# Patient Record
Sex: Female | Born: 1975
Health system: Southern US, Community
[De-identification: ages and names within clinical notes are randomized; demographics above are authoritative.]

## PROBLEM LIST (undated history)

## (undated) DIAGNOSIS — I10 Essential (primary) hypertension: Secondary | ICD-10-CM

## (undated) DIAGNOSIS — E669 Obesity, unspecified: Secondary | ICD-10-CM

## (undated) DIAGNOSIS — F319 Bipolar disorder, unspecified: Secondary | ICD-10-CM

## (undated) DIAGNOSIS — F329 Major depressive disorder, single episode, unspecified: Secondary | ICD-10-CM

## (undated) DIAGNOSIS — F419 Anxiety disorder, unspecified: Secondary | ICD-10-CM

## (undated) DIAGNOSIS — N921 Excessive and frequent menstruation with irregular cycle: Principal | ICD-10-CM

## (undated) HISTORY — DX: Major depressive disorder, single episode, unspecified: F32.9

## (undated) HISTORY — PX: WISDOM TOOTH EXTRACTION: SHX21

## (undated) HISTORY — DX: Obesity, unspecified: E66.9

## (undated) HISTORY — DX: Excessive and frequent menstruation with irregular cycle: N92.1

## (undated) HISTORY — DX: Bipolar disorder, unspecified: F31.9

## (undated) HISTORY — DX: Essential (primary) hypertension: I10

## (undated) HISTORY — DX: Anxiety disorder, unspecified: F41.9

---

## 1998-05-19 ENCOUNTER — Other Ambulatory Visit: Admission: RE | Admit: 1998-05-19 | Discharge: 1998-05-19 | Payer: Self-pay | Admitting: Obstetrics and Gynecology

## 1998-08-28 ENCOUNTER — Inpatient Hospital Stay (HOSPITAL_COMMUNITY): Admission: AD | Admit: 1998-08-28 | Discharge: 1998-08-28 | Payer: Self-pay | Admitting: Gynecology

## 1998-08-29 ENCOUNTER — Encounter: Payer: Self-pay | Admitting: Gynecology

## 1998-08-30 ENCOUNTER — Encounter: Payer: Self-pay | Admitting: Gynecology

## 1998-08-30 ENCOUNTER — Inpatient Hospital Stay (HOSPITAL_COMMUNITY): Admission: AD | Admit: 1998-08-30 | Discharge: 1998-08-31 | Payer: Self-pay | Admitting: Gynecology

## 1998-12-30 ENCOUNTER — Inpatient Hospital Stay (HOSPITAL_COMMUNITY): Admission: AD | Admit: 1998-12-30 | Discharge: 1999-01-01 | Payer: Self-pay | Admitting: Obstetrics and Gynecology

## 1999-01-22 ENCOUNTER — Encounter (HOSPITAL_COMMUNITY): Admission: RE | Admit: 1999-01-22 | Discharge: 1999-04-22 | Payer: Self-pay | Admitting: Obstetrics and Gynecology

## 1999-02-22 ENCOUNTER — Other Ambulatory Visit: Admission: RE | Admit: 1999-02-22 | Discharge: 1999-02-22 | Payer: Self-pay | Admitting: Obstetrics and Gynecology

## 2000-02-26 ENCOUNTER — Other Ambulatory Visit: Admission: RE | Admit: 2000-02-26 | Discharge: 2000-02-26 | Payer: Self-pay | Admitting: *Deleted

## 2002-11-09 ENCOUNTER — Other Ambulatory Visit: Admission: RE | Admit: 2002-11-09 | Discharge: 2002-11-09 | Payer: Self-pay | Admitting: Family Medicine

## 2004-05-05 ENCOUNTER — Ambulatory Visit: Payer: Self-pay | Admitting: Family Medicine

## 2004-08-03 ENCOUNTER — Ambulatory Visit: Payer: Self-pay | Admitting: Family Medicine

## 2004-11-03 ENCOUNTER — Other Ambulatory Visit (HOSPITAL_COMMUNITY): Admission: RE | Admit: 2004-11-03 | Discharge: 2004-11-20 | Payer: Self-pay | Admitting: Psychiatry

## 2004-11-03 ENCOUNTER — Ambulatory Visit: Payer: Self-pay | Admitting: Psychiatry

## 2010-12-09 ENCOUNTER — Inpatient Hospital Stay (INDEPENDENT_AMBULATORY_CARE_PROVIDER_SITE_OTHER)
Admission: RE | Admit: 2010-12-09 | Discharge: 2010-12-09 | Disposition: A | Discharge status: Home / Self Care | Payer: Medicaid Other | Source: Ambulatory Visit | Attending: Emergency Medicine | Admitting: Emergency Medicine

## 2010-12-09 DIAGNOSIS — IMO0002 Reserved for concepts with insufficient information to code with codable children: Secondary | ICD-10-CM

## 2010-12-09 DIAGNOSIS — X500XXA Overexertion from strenuous movement or load, initial encounter: Secondary | ICD-10-CM

## 2011-03-22 ENCOUNTER — Ambulatory Visit (INDEPENDENT_AMBULATORY_CARE_PROVIDER_SITE_OTHER): Payer: Medicaid Other | Admitting: Physician Assistant

## 2011-03-22 ENCOUNTER — Other Ambulatory Visit (HOSPITAL_COMMUNITY)
Admission: RE | Admit: 2011-03-22 | Discharge: 2011-03-22 | Disposition: A | Discharge status: Home / Self Care | Payer: Medicaid Other | Source: Ambulatory Visit | Attending: Physician Assistant | Admitting: Physician Assistant

## 2011-03-22 ENCOUNTER — Encounter: Payer: Self-pay | Admitting: Physician Assistant

## 2011-03-22 VITALS — BP 152/100 | HR 89 | Temp 98.6°F | Ht 64.0 in | Wt 242.5 lb

## 2011-03-22 DIAGNOSIS — N921 Excessive and frequent menstruation with irregular cycle: Secondary | ICD-10-CM

## 2011-03-22 DIAGNOSIS — F329 Major depressive disorder, single episode, unspecified: Secondary | ICD-10-CM

## 2011-03-22 DIAGNOSIS — N920 Excessive and frequent menstruation with regular cycle: Secondary | ICD-10-CM

## 2011-03-22 DIAGNOSIS — Z23 Encounter for immunization: Secondary | ICD-10-CM

## 2011-03-22 DIAGNOSIS — F32A Depression, unspecified: Secondary | ICD-10-CM

## 2011-03-22 LAB — POCT PREGNANCY, URINE: Preg Test, Ur: NEGATIVE

## 2011-03-22 MED ORDER — HYDROCHLOROTHIAZIDE 25 MG PO TABS: 25.0000 mg | ORAL_TABLET | Freq: Every day | ORAL | Status: DC

## 2011-03-22 MED ORDER — IBUPROFEN 600 MG PO TABS: 600.0000 mg | ORAL_TABLET | Freq: Once | ORAL | Status: AC

## 2011-03-22 NOTE — Patient Instructions (Signed)
Levonorgestrel intrauterine device (IUD) What is this medicine? LEVONORGESTREL IUD (LEE voe nor jes trel) is a contraceptive (birth control) device. It is used to prevent pregnancy and to treat heavy bleeding that occurs during your period. It can be used for up to 5 years. This medicine may be used for other purposes; ask your health care provider or pharmacist if you have questions. What should I tell my health care provider before I take this medicine? They need to know if you have any of these conditions: -abnormal Pap smear -cancer of the breast, uterus, or cervix -diabetes -endometritis -genital or pelvic infection now or in the past -have more than one sexual partner or your partner has more than one partner -heart disease -history of an ectopic or tubal pregnancy -immune system problems -IUD in place -liver disease or tumor -problems with blood clots or take blood-thinners -use intravenous drugs -uterus of unusual shape -vaginal bleeding that has not been explained -an unusual or allergic reaction to levonorgestrel, other hormones, silicone, or polyethylene, medicines, foods, dyes, or preservatives -pregnant or trying to get pregnant -breast-feeding How should I use this medicine? This device is placed inside the uterus by a health care professional. Talk to your pediatrician regarding the use of this medicine in children. Special care may be needed. Overdosage: If you think you have taken too much of this medicine contact a poison control center or emergency room at once. NOTE: This medicine is only for you. Do not share this medicine with others. What if I miss a dose? This does not apply. What may interact with this medicine? Do not take this medicine with any of the following medications: -amprenavir -bosentan -fosamprenavir This medicine may also interact with the following medications: -aprepitant -barbiturate medicines for inducing sleep or treating  seizures -bexarotene -griseofulvin -medicines to treat seizures like carbamazepine, ethotoin, felbamate, oxcarbazepine, phenytoin, topiramate -modafinil -pioglitazone -rifabutin -rifampin -rifapentine -some medicines to treat HIV infection like atazanavir, indinavir, lopinavir, nelfinavir, tipranavir, ritonavir -St. John's wort -warfarin This list may not describe all possible interactions. Give your health care provider a list of all the medicines, herbs, non-prescription drugs, or dietary supplements you use. Also tell them if you smoke, drink alcohol, or use illegal drugs. Some items may interact with your medicine. What should I watch for while using this medicine? Visit your doctor or health care professional for regular check ups. See your doctor if you or your partner has sexual contact with others, becomes HIV positive, or gets a sexual transmitted disease. This product does not protect you against HIV infection (AIDS) or other sexually transmitted diseases. You can check the placement of the IUD yourself by reaching up to the top of your vagina with clean fingers to feel the threads. Do not pull on the threads. It is a good habit to check placement after each menstrual period. Call your doctor right away if you feel more of the IUD than just the threads or if you cannot feel the threads at all. The IUD may come out by itself. You may become pregnant if the device comes out. If you notice that the IUD has come out use a backup birth control method like condoms and call your health care provider. Using tampons will not change the position of the IUD and are okay to use during your period. What side effects may I notice from receiving this medicine? Side effects that you should report to your doctor or health care professional as soon as possible: -allergic reactions   like skin rash, itching or hives, swelling of the face, lips, or tongue -fever, flu-like symptoms -genital sores -high  blood pressure -no menstrual period for 6 weeks during use -pain, swelling, warmth in the leg -pelvic pain or tenderness -severe or sudden headache -signs of pregnancy -stomach cramping -sudden shortness of breath -trouble with balance, talking, or walking -unusual vaginal bleeding, discharge -yellowing of the eyes or skin Side effects that usually do not require medical attention (report to your doctor or health care professional if they continue or are bothersome): -acne -breast pain -change in sex drive or performance -changes in weight -cramping, dizziness, or faintness while the device is being inserted -headache -irregular menstrual bleeding within first 3 to 6 months of use -nausea This list may not describe all possible side effects. Call your doctor for medical advice about side effects. You may report side effects to FDA at 1-800-FDA-1088. Where should I keep my medicine? This does not apply. NOTE: This sheet is a summary. It may not cover all possible information. If you have questions about this medicine, talk to your doctor, pharmacist, or health care provider.  2012, Elsevier/Gold Standard. (06/11/2008 6:39:08 PM)Endometrial Biopsy This is a test in which a tissue sample (a biopsy) is taken from inside the uterus (womb). It is then looked at by a specialist under a microscope to see if the tissue is normal or abnormal. The endometrium is the lining of the uterus. This test helps determine where you are in your menstrual cycle and how hormone levels are affecting the lining of the uterus. Another use for this test is to diagnose endometrial cancer, tuberculosis, polyps, or inflammatory conditions and to evaluate uterine bleeding. PREPARATION FOR TEST No preparation or fasting is necessary. NORMAL FINDINGS No pathologic conditions. Presence of "secretory-type" endometrium 3 to 5 days before to normal menstruation. Ranges for normal findings may vary among different  laboratories and hospitals. You should always check with your doctor after having lab work or other tests done to discuss the meaning of your test results and whether your values are considered within normal limits. MEANING OF TEST  Your caregiver will go over the test results with you and discuss the importance and meaning of your results, as well as treatment options and the need for additional tests if necessary. OBTAINING THE TEST RESULTS It is your responsibility to obtain your test results. Ask the lab or department performing the test when and how you will get your results. Document Released: 09/21/2004 Document Revised: 01/31/2011 Document Reviewed: 04/30/2008 [facility] Patient Information 2012 [place], [place].

## 2011-03-22 NOTE — Progress Notes (Signed)
Chief Complaint:  Menorrhagia   Teresa Herrera is  35 y.o. G2P1011.  Patient's last menstrual period was 02/14/2011.Marland Kitchen  Her pregnancy status is negative.  She presents complaining of Menorrhagia . Onset is described as sudden and has been present for  6 months.   Obstetrical/Gynecological History: Pertinent Gynecological History: Menses: flow is moderate, irregular occurring approximately every 28 days with spotting approximately 28 days per month and moderate cramping, golfball-sized clots Bleeding: intermenstrual bleeding and dysfunctional uterine bleeding Contraception: abstinence DES exposure: denies Blood transfusions: none Sexually transmitted diseases: no past history Previous GYN Procedures: none  Last mammogram: n/a Date:  Last pap: normal Date: last last year   Past Medical History: Past Medical History  Diagnosis Date  . Depression   . Bipolar 1 disorder     Past Surgical History: History reviewed. No pertinent past surgical history.  Family History: Family History  Problem Relation Age of Onset  . Hypertension Mother     Social History: History  Substance Use Topics  . Smoking status: Never Smoker   . Smokeless tobacco: Never Used  . Alcohol Use: 0.0 oz/week     drink socially    Allergies:  Allergies  Allergen Reactions  . Cauliflower Hives  . Demerol     Hallucinations   . Sulfa Drugs Cross Reactors Hives    Review of Systems - Negative except has been reviewed in the HPI  Physical Exam   Blood pressure 152/100, pulse 89, temperature 98.6 F (37 C), temperature source Oral, height 5\' 4"  (1.626 m), weight 242 lb 8 oz (109.997 kg), last menstrual period 02/14/2011.  General: General appearance - alert, well appearing, and in no distress, oriented to person, place, and time and overweight Mental status - alert, oriented to person, place, and time, normal mood, behavior, speech, dress, motor activity, and thought processes, affect  appropriate to mood Abdomen - soft, nontender, nondistended, no masses or organomegaly no CVA tenderness Focused Gynecological Exam: VULVA: normal appearing vulva with no masses, tenderness or lesions, VAGINA: vaginal discharge - bloody, CERVIX: normal appearing cervix without discharge or lesions, multiparous os, UTERUS: exam limited by pt habitus, nontender, ADNEXA:  exam limited by pt habitus, nontender,  Patient given informed consent, signed copy in the chart, time out was performed. Appropriate time out taken. . The patient was placed in the lithotomy position and the cervix brought into view with sterile speculum.  Portio of cervix cleansed x 2 with betadine swabs.  A tenaculum was placed in the anterior lip of the cervix.  The uterus was sounded for depth of 8.5cm. A pipelle was introduced to into the uterus, suction created,  and an endometrial sample was obtained. All equipment was removed and accounted for.  The patient tolerated the procedure well.    Labs: Recent Results (from the past 24 hour(s))  POCT PREGNANCY, URINE   Collection Time   03/22/11  1:50 PM      Component Value Range   Preg Test, Ur NEGATIVE      Assessment: Menorrhagia Hytertension  Plan: Endo Bx obtained Pelvic US scheduled Treatment options reviewed, pt strongly desires Mirena FU in 2-3 weeks for likely Mirena placement and review of results Rx HCTZ 25mg  po daily, referral to [facility] for primary care  Darria Corvera E. 03/22/2011,2:21 PM

## 2011-03-23 ENCOUNTER — Encounter: Payer: Self-pay | Admitting: Physician Assistant

## 2011-03-23 DIAGNOSIS — F32A Depression, unspecified: Secondary | ICD-10-CM | POA: Insufficient documentation

## 2011-03-23 DIAGNOSIS — F329 Major depressive disorder, single episode, unspecified: Secondary | ICD-10-CM | POA: Insufficient documentation

## 2011-03-23 DIAGNOSIS — N921 Excessive and frequent menstruation with irregular cycle: Secondary | ICD-10-CM | POA: Insufficient documentation

## 2011-03-23 HISTORY — DX: Excessive and frequent menstruation with irregular cycle: N92.1

## 2011-03-23 HISTORY — DX: Depression, unspecified: F32.A

## 2011-03-28 ENCOUNTER — Ambulatory Visit (HOSPITAL_COMMUNITY)
Admission: RE | Admit: 2011-03-28 | Discharge: 2011-03-28 | Disposition: A | Discharge status: Home / Self Care | Payer: Medicaid Other | Source: Ambulatory Visit | Attending: Physician Assistant | Admitting: Physician Assistant

## 2011-03-28 DIAGNOSIS — N949 Unspecified condition associated with female genital organs and menstrual cycle: Secondary | ICD-10-CM | POA: Insufficient documentation

## 2011-03-28 DIAGNOSIS — N925 Other specified irregular menstruation: Secondary | ICD-10-CM | POA: Insufficient documentation

## 2011-03-28 DIAGNOSIS — D251 Intramural leiomyoma of uterus: Secondary | ICD-10-CM | POA: Insufficient documentation

## 2011-03-28 DIAGNOSIS — N921 Excessive and frequent menstruation with irregular cycle: Secondary | ICD-10-CM

## 2011-03-28 DIAGNOSIS — N938 Other specified abnormal uterine and vaginal bleeding: Secondary | ICD-10-CM | POA: Insufficient documentation

## 2011-04-30 ENCOUNTER — Ambulatory Visit: Payer: Medicaid Other | Admitting: Advanced Practice Midwife

## 2011-05-03 ENCOUNTER — Ambulatory Visit (INDEPENDENT_AMBULATORY_CARE_PROVIDER_SITE_OTHER): Payer: Medicaid Other | Admitting: Advanced Practice Midwife

## 2011-05-03 ENCOUNTER — Encounter: Payer: Self-pay | Admitting: Advanced Practice Midwife

## 2011-05-03 VITALS — BP 142/86 | HR 89 | Temp 98.0°F | Ht 64.0 in | Wt 249.7 lb

## 2011-05-03 DIAGNOSIS — Z01812 Encounter for preprocedural laboratory examination: Secondary | ICD-10-CM

## 2011-05-03 DIAGNOSIS — Z30430 Encounter for insertion of intrauterine contraceptive device: Secondary | ICD-10-CM

## 2011-05-03 DIAGNOSIS — N920 Excessive and frequent menstruation with regular cycle: Secondary | ICD-10-CM

## 2011-05-03 DIAGNOSIS — Z23 Encounter for immunization: Secondary | ICD-10-CM

## 2011-05-03 DIAGNOSIS — N921 Excessive and frequent menstruation with irregular cycle: Secondary | ICD-10-CM

## 2011-05-03 LAB — CBC
HCT: 35.3 % — ABNORMAL LOW (ref 36.0–46.0)
Hemoglobin: 11.4 g/dL — ABNORMAL LOW (ref 12.0–15.0)
MCH: 26.2 pg (ref 26.0–34.0)
MCHC: 32.3 g/dL (ref 30.0–36.0)
MCV: 81.1 fL (ref 78.0–100.0)
Platelets: 366 10*3/uL (ref 150–400)
RBC: 4.35 MIL/uL (ref 3.87–5.11)
RDW: 14.2 % (ref 11.5–15.5)
WBC: 10.2 10*3/uL (ref 4.0–10.5)

## 2011-05-03 LAB — TSH: TSH: 1.823 u[IU]/mL (ref 0.350–4.500)

## 2011-05-03 LAB — POCT PREGNANCY, URINE: Preg Test, Ur: NEGATIVE

## 2011-05-03 MED ORDER — IBUPROFEN 200 MG PO TABS
600.0000 mg | ORAL_TABLET | Freq: Once | ORAL | Status: AC
  Administered 2011-05-03: 600 mg via ORAL

## 2011-05-03 NOTE — Progress Notes (Signed)
  Subjective:    Patient ID: Teresa Herrera, female    DOB: 07-26-1975, 35 y.o.   MRN: [id number]  HPI: Desires Mirena for Tx of menometrorrhagia. Not sexually active. Declines GC/CT testing. Review [place] results.  Review of Systems: Neg     Objective:   Physical Exam Procedure  IUD Insertion Procedure Note  Pre-operative Diagnosis: Menometrorrhagia  Post-operative Diagnosis: same  Indications: Menometrorrhagia  Procedure Details  Urine pregnancy test was done today and result was neg.  The risks (including infection, bleeding, pain, and uterine perforation) and benefits of the procedure were explained to the patient and Written informed consent was obtained.    Cervix cleansed with Betadine. Uterus sounded to 7 cm. IUD inserted without difficulty. String visible and trimmed. Patient tolerated procedure well initially, but started cramping a few minutes after sitting up. She was given Ibuprofen and felt better. She went to the lab for flu vaccine and vomited ~ 20 minutes after insertion, then reported feeling fine. No VB or further cramping.  IUD Information: See RN note  Condition: Stable  Complications: None  Plan:  The patient was advised to call for any fever or for prolonged or severe pain or bleeding. She was advised to use OTC ibuprofen as needed for mild to moderate pain.      Assessment & Plan:  F/U 6 weeks for string check or PRN for worsening of Sx. FU vaccine given  Karol Skarzynski 05/03/2011 3:12 PM

## 2011-05-03 NOTE — Progress Notes (Signed)
After IUD insertion completed, while patient in lab area to get blood draw, pt. C/o severe cramps and vomitied repeatedly small amounts digested food/liquid. States cramps relieved after vomiting. Had patient to rest for a few minutes until sure she felt well before she left.

## 2011-05-03 NOTE — Patient Instructions (Signed)

## 2011-06-14 ENCOUNTER — Encounter: Payer: Self-pay | Admitting: Physician Assistant

## 2011-06-14 ENCOUNTER — Ambulatory Visit (INDEPENDENT_AMBULATORY_CARE_PROVIDER_SITE_OTHER): Payer: Medicaid Other | Admitting: Physician Assistant

## 2011-06-14 VITALS — BP 148/93 | HR 86 | Temp 97.0°F | Ht 64.5 in | Wt 248.3 lb

## 2011-06-14 DIAGNOSIS — Z30431 Encounter for routine checking of intrauterine contraceptive device: Secondary | ICD-10-CM

## 2011-06-14 NOTE — Patient Instructions (Signed)

## 2011-06-14 NOTE — Progress Notes (Signed)
Chief Complaint:  IUD string check   Teresa Herrera is  36 y.o. G2P1011.  Patient's last menstrual period was 06/11/2011..  She presents complaining of IUD string check  Reports spotting only. Very pleased with results of Mirena in decreasing bleeding. Reports occassional lower abd cramping well controlled with prn NSAIDs  Obstetrical/Gynecological History: OB History    Grav Para Term Preterm Abortions TAB SAB Ect Mult Living   2 1 1  1  0 1 0 0 1      Past Medical History: Past Medical History  Diagnosis Date  . Bipolar 1 disorder   . Menometrorrhagia 03/23/2011  . Depression   . Depression 03/23/2011  . Hypertension     Past Surgical History: Past Surgical History  Procedure Date  . Wisdom tooth extraction     Family History: Family History  Problem Relation Age of Onset  . Hypertension Mother     Social History: History  Substance Use Topics  . Smoking status: Never Smoker   . Smokeless tobacco: Never Used  . Alcohol Use: 0.0 oz/week     drink socially    Allergies:  Allergies  Allergen Reactions  . Cauliflower Hives  . Demerol     Hallucinations   . Sulfa Drugs Cross Reactors Hives     (Not in a hospital admission)  Review of Systems - Negative except what has been reviewed in HPI  Physical Exam   Blood pressure 148/93, pulse 86, temperature 97 F (36.1 C), temperature source Oral, height 5' 4.5" (1.638 m), weight 248 lb 4.8 oz (112.628 kg), last menstrual period 06/11/2011.  General: General appearance - alert, well appearing, and in no distress, oriented to person, place, and time and overweight Mental status - alert, oriented to person, place, and time, normal mood, behavior, speech, dress, motor activity, and thought processes, affect appropriate to mood Focused Gynecological Exam: IUD strings visualized below cervical os   Assessment: IUD string check Patient Active Problem List  Diagnoses  . Depression  . Menometrorrhagia     Plan: FU prn problems PAP due 2015  Nakia Remmers E. 06/14/2011,4:08 PM

## 2012-03-19 ENCOUNTER — Encounter: Payer: No Typology Code available for payment source | Attending: Family Medicine | Admitting: *Deleted

## 2012-03-19 ENCOUNTER — Encounter: Payer: Self-pay | Admitting: *Deleted

## 2012-03-19 VITALS — Ht 64.25 in | Wt 246.9 lb

## 2012-03-19 DIAGNOSIS — Z713 Dietary counseling and surveillance: Secondary | ICD-10-CM | POA: Insufficient documentation

## 2012-03-19 DIAGNOSIS — I10 Essential (primary) hypertension: Secondary | ICD-10-CM | POA: Insufficient documentation

## 2012-03-19 NOTE — Progress Notes (Signed)
  Medical Nutrition Therapy:  Appt start time: 1130 end time:  1230.  Assessment: Morbid Obesity, HTN.  Teresa Herrera comes today for MNT and states she needs to lose 25 lbs. Has HTN and states MD wants her to cut salt out of her diet. Reports she is "stabilizing" from forgetting to take her meds for 3 days. Cannot afford fresh F/V or lean cuts of meat and skips lunch daily. Eats candy and meals out often because "it's cheaper". Trying to stay away from lunch meat and other foods with high sodium. Has gotten better about not eating sweets and has switched to diet soda.  No structured exercise reported.  MEDICATIONS: See medications. Forgets to take them for 2-3 days at a time.    DIETARY INTAKE:  Usual eating pattern includes 1-2 meals and 0-2 snacks per day.  24-hr recall:  B (AM): NONE or fruit smoothie w/ greek yogurt (1-2 Tbsp) and a little mango or green Naked juice (made at home)  Snk (AM): None (d/t no snacks available) or candy (cheaper and available)  L (PM): NONE/Water  Snk ( PM): None D ( PM): Spaghetti or roast beef sandwich ([employer]) Snk ( PM): None Beverages: water, soda/diet soda  Usual physical activity:  None reported; states "I am constantly on the go"  Estimated energy needs: 1300 calories 150 g carbohydrates 90 g protein 40 g fat  Progress Towards Goal(s):  Some progress.   Nutritional Diagnosis:  Sherman-3.3 Overweight/obesity related to h/o poor food choices as evidenced by patient referral for MNT with BMI of 42.1 kg/m^2.    Intervention:  Nutrition education.  Handouts given at visit include:   HTN handout  Low Sodium Seasonings handout  30g CHO menu  Meal Planning Card  Goals:  Eat 3 meals/day; Avoid meal skipping   Increase protein rich foods  Limit carbohydrate to 2 servings (30 grams) per meal and 1 serving (15 grams) per snack  Choose more whole grains, lean protein, low-fat dairy, and fruits/non-starchy vegetables   Aim for >30 min of physical  activity daily  Limit sugar-sweetened beverages and concentrated sweets  Aim for <2000 mg of sodium daily - Watch processed foods (see handout)  Monitoring/Evaluation:  Dietary intake, exercise, BP, and body weight in 6 week(s).

## 2012-03-25 ENCOUNTER — Encounter: Payer: Self-pay | Admitting: *Deleted

## 2012-03-25 NOTE — Patient Instructions (Addendum)
Goals:  Eat 3 meals/day, Avoid meal skipping   Increase protein rich foods  Limit carbohydrate to 2 servings (30 grams) per meal and 1 serving (15 grams) per snack.  Choose more whole grains, lean protein, low-fat dairy, and fruits/non-starchy vegetables.   Aim for >30 min of physical activity daily  Limit sugar-sweetened beverages and concentrated sweets  Aim for <2000 mg of sodium daily - Watch processed foods (see handout)

## 2013-12-19 ENCOUNTER — Emergency Department (HOSPITAL_COMMUNITY)
Admission: EM | Admit: 2013-12-19 | Discharge: 2013-12-19 | Disposition: A | Discharge status: Home / Self Care | Payer: BC Managed Care – PPO | Source: Home / Self Care | Attending: Emergency Medicine | Admitting: Emergency Medicine

## 2013-12-19 ENCOUNTER — Encounter (HOSPITAL_COMMUNITY): Payer: Self-pay | Admitting: Emergency Medicine

## 2013-12-19 DIAGNOSIS — H60399 Other infective otitis externa, unspecified ear: Secondary | ICD-10-CM

## 2013-12-19 DIAGNOSIS — H6001 Abscess of right external ear: Secondary | ICD-10-CM

## 2013-12-19 MED ORDER — HYDROMORPHONE HCL PF 1 MG/ML IJ SOLN
2.0000 mg | Freq: Once | INTRAMUSCULAR | Status: AC
  Administered 2013-12-19: 2 mg via INTRAMUSCULAR

## 2013-12-19 MED ORDER — CEFTRIAXONE SODIUM 1 G IJ SOLR
INTRAMUSCULAR | Status: AC
  Filled 2013-12-19: qty 10

## 2013-12-19 MED ORDER — LIDOCAINE HCL (PF) 1 % IJ SOLN
INTRAMUSCULAR | Status: AC
  Filled 2013-12-19: qty 5

## 2013-12-19 MED ORDER — CLINDAMYCIN HCL 300 MG PO CAPS: 300.0000 mg | ORAL_CAPSULE | Freq: Four times a day (QID) | ORAL | Status: DC

## 2013-12-19 MED ORDER — HYDROMORPHONE HCL PF 1 MG/ML IJ SOLN
INTRAMUSCULAR | Status: AC
  Filled 2013-12-19: qty 2

## 2013-12-19 MED ORDER — CEFTRIAXONE SODIUM 1 G IJ SOLR
1.0000 g | Freq: Once | INTRAMUSCULAR | Status: AC
  Administered 2013-12-19: 1 g via INTRAMUSCULAR

## 2013-12-19 MED ORDER — ONDANSETRON 4 MG PO TBDP
8.0000 mg | ORAL_TABLET | Freq: Once | ORAL | Status: AC
  Administered 2013-12-19: 8 mg via ORAL

## 2013-12-19 MED ORDER — OXYCODONE-ACETAMINOPHEN 5-325 MG PO TABS: ORAL_TABLET | ORAL | Status: DC

## 2013-12-19 MED ORDER — ONDANSETRON 4 MG PO TBDP
ORAL_TABLET | ORAL | Status: AC
  Filled 2013-12-19: qty 2

## 2013-12-19 NOTE — ED Notes (Signed)
C/o pain in right ear since Thursday.  Denies fever and drainage.  No relief with otc ear drops.  States "ear drops seem to have made it worse".

## 2013-12-19 NOTE — ED Provider Notes (Signed)
Chief Complaint   Chief Complaint  Patient presents with  . Otalgia    History of Present Illness   Teresa Herrera is a 38 year old female who has had a three-day history of severe right ear pain. She denies any drainage. She's had no fever, headache, stiff neck, nasal congestion, rhinorrhea, sore throat, or adenopathy. She denies getting any water in her ear or swimming.  Review of Systems   Other than as noted above, the patient denies any of the following symptoms: Systemic:  No fevers or chills. Eye:  No redness, pain, discharge, itching, blurred vision, or diplopia. ENT:  No headache, nasal congestion, sneezing, itching, epistaxis, ear pain, decreased hearing, ringing in ears, vertigo, or tinnitus.  No oral lesions, sore throat, or hoarseness. Neck:  No neck pain or adenopathy. Skin:  No rash or itching.  Cole   Past medical history, family history, social history, meds, and allergies were reviewed. She is allergic to Demerol and sulfa.  Physical Examination     Vital signs:  BP 139/91  Pulse 86  Temp(Src) 98.6 F (37 C) (Oral)  Resp 18  SpO2 98% General:  Alert and oriented. She is crying in pain.  Skin warm and dry. Eye:  PERRL, full EOMs, lids and conjunctiva normal.   ENT:  She has a small furuncle on her right external ear canal, just inside the ear canal, inferiorly. The remainder of the ear canal was normal as was the tympanic membrane, left TM and canal are normal.  Nasal mucosa not congested and without drainage.  Mucous membranes moist, no oral lesions, normal dentition, pharynx clear.  No cranial or facial pain to palplation. There is no pain or swelling over the mastoid. Neck:  Supple, full ROM.  No adenopathy, tenderness or mass.  Thyroid normal. Lungs:  Breath sounds clear and equal bilaterally.  No wheezes, rales or rhonchi. Heart:  Rhythm regular, without extrasystoles.  No gallops or murmers. Skin:  Clear, warm and dry.  Course in Urgent [facility]   The following medications were given:  Medications  HYDROmorphone (DILAUDID) injection 2 mg (2 mg Intramuscular Given 12/19/13 1425)  cefTRIAXone (ROCEPHIN) injection 1 g (1 g Intramuscular Given 12/19/13 1425)  ondansetron (ZOFRAN-ODT) disintegrating tablet 8 mg (8 mg Oral Given 12/19/13 1425)    Assessment   The encounter diagnosis was Abscess, ear canal, right.  This is a very small abscess, but she has lots of pain with it. She'll be covered with medicines as listed below. Return again in 48 hours if she is no better, or sooner if she becomes worse.  Plan    1.  Meds:  The following meds were prescribed:   Discharge Medication List as of 12/19/2013  2:07 PM    START taking these medications   Details  clindamycin (CLEOCIN) 300 MG capsule Take 1 capsule (300 mg total) by mouth 4 (four) times daily., Starting 12/19/2013, Until Discontinued, Print    oxyCODONE-acetaminophen (PERCOCET) 5-325 MG per tablet 1 to 2 tablets every 6 hours as needed for pain., Print        2.  Patient Education/Counseling:  The patient was given appropriate handouts, self care instructions, and instructed in symptomatic relief.  Suggested moist, warm compresses to the ear.  3.  Follow up:  The patient was told to follow up here if no better in 3 to 4 days, or sooner if becoming worse in any way, and given some red flag symptoms such as increasing pain, fever,  headache, or stiff neck which would prompt immediate return.       Harden Mo, MD 12/19/13 [phone]

## 2013-12-19 NOTE — Discharge Instructions (Signed)

## 2013-12-19 NOTE — ED Notes (Signed)
Pt given injections.  Will discharge at 2:40 p.m.  Mw,cma

## 2014-01-08 ENCOUNTER — Emergency Department (HOSPITAL_COMMUNITY): Payer: BC Managed Care – PPO

## 2014-01-08 ENCOUNTER — Emergency Department (HOSPITAL_COMMUNITY)
Admission: EM | Admit: 2014-01-08 | Discharge: 2014-01-08 | Disposition: A | Discharge status: Home / Self Care | Payer: BC Managed Care – PPO | Attending: Emergency Medicine | Admitting: Emergency Medicine

## 2014-01-08 ENCOUNTER — Encounter (HOSPITAL_COMMUNITY): Payer: Self-pay | Admitting: Emergency Medicine

## 2014-01-08 DIAGNOSIS — Z8742 Personal history of other diseases of the female genital tract: Secondary | ICD-10-CM | POA: Insufficient documentation

## 2014-01-08 DIAGNOSIS — R112 Nausea with vomiting, unspecified: Secondary | ICD-10-CM | POA: Insufficient documentation

## 2014-01-08 DIAGNOSIS — R5381 Other malaise: Secondary | ICD-10-CM | POA: Insufficient documentation

## 2014-01-08 DIAGNOSIS — Z3202 Encounter for pregnancy test, result negative: Secondary | ICD-10-CM | POA: Insufficient documentation

## 2014-01-08 DIAGNOSIS — R1084 Generalized abdominal pain: Secondary | ICD-10-CM

## 2014-01-08 DIAGNOSIS — Z79899 Other long term (current) drug therapy: Secondary | ICD-10-CM | POA: Insufficient documentation

## 2014-01-08 DIAGNOSIS — R197 Diarrhea, unspecified: Secondary | ICD-10-CM | POA: Insufficient documentation

## 2014-01-08 DIAGNOSIS — F319 Bipolar disorder, unspecified: Secondary | ICD-10-CM | POA: Insufficient documentation

## 2014-01-08 DIAGNOSIS — F411 Generalized anxiety disorder: Secondary | ICD-10-CM | POA: Insufficient documentation

## 2014-01-08 DIAGNOSIS — Z792 Long term (current) use of antibiotics: Secondary | ICD-10-CM | POA: Insufficient documentation

## 2014-01-08 DIAGNOSIS — I10 Essential (primary) hypertension: Secondary | ICD-10-CM | POA: Insufficient documentation

## 2014-01-08 DIAGNOSIS — R5383 Other fatigue: Secondary | ICD-10-CM

## 2014-01-08 DIAGNOSIS — R509 Fever, unspecified: Secondary | ICD-10-CM | POA: Insufficient documentation

## 2014-01-08 DIAGNOSIS — E669 Obesity, unspecified: Secondary | ICD-10-CM | POA: Insufficient documentation

## 2014-01-08 DIAGNOSIS — A0472 Enterocolitis due to Clostridium difficile, not specified as recurrent: Secondary | ICD-10-CM

## 2014-01-08 LAB — COMPREHENSIVE METABOLIC PANEL
ALT: 17 U/L (ref 0–35)
AST: 21 U/L (ref 0–37)
Albumin: 3 g/dL — ABNORMAL LOW (ref 3.5–5.2)
Alkaline Phosphatase: 98 U/L (ref 39–117)
Anion gap: 12 (ref 5–15)
BUN: 5 mg/dL — ABNORMAL LOW (ref 6–23)
CO2: 23 mEq/L (ref 19–32)
Calcium: 8.6 mg/dL (ref 8.4–10.5)
Chloride: 102 mEq/L (ref 96–112)
Creatinine, Ser: 1.03 mg/dL (ref 0.50–1.10)
GFR calc Af Amer: 79 mL/min — ABNORMAL LOW (ref >90)
GFR calc non Af Amer: 68 mL/min — ABNORMAL LOW (ref >90)
Glucose, Bld: 97 mg/dL (ref 70–99)
Potassium: 4 mEq/L (ref 3.7–5.3)
Sodium: 137 mEq/L (ref 137–147)
Total Bilirubin: 0.2 mg/dL — ABNORMAL LOW (ref 0.3–1.2)
Total Protein: 7.7 g/dL (ref 6.0–8.3)

## 2014-01-08 LAB — CBC WITH DIFFERENTIAL/PLATELET
Basophils Absolute: 0.1 10*3/uL (ref 0.0–0.1)
Basophils Relative: 1 % (ref 0–1)
Eosinophils Absolute: 0 10*3/uL (ref 0.0–0.7)
Eosinophils Relative: 0 % (ref 0–5)
HCT: 36.3 % (ref 36.0–46.0)
Hemoglobin: 12.2 g/dL (ref 12.0–15.0)
Lymphocytes Relative: 22 % (ref 12–46)
Lymphs Abs: 2.1 10*3/uL (ref 0.7–4.0)
MCH: 28.8 pg (ref 26.0–34.0)
MCHC: 33.6 g/dL (ref 30.0–36.0)
MCV: 85.8 fL (ref 78.0–100.0)
Monocytes Absolute: 0.7 10*3/uL (ref 0.1–1.0)
Monocytes Relative: 8 % (ref 3–12)
Neutro Abs: 6.4 10*3/uL (ref 1.7–7.7)
Neutrophils Relative %: 69 % (ref 43–77)
Platelets: 277 10*3/uL (ref 150–400)
RBC: 4.23 MIL/uL (ref 3.87–5.11)
RDW: 13.6 % (ref 11.5–15.5)
WBC: 9.3 10*3/uL (ref 4.0–10.5)

## 2014-01-08 LAB — URINALYSIS, ROUTINE W REFLEX MICROSCOPIC
Bilirubin Urine: NEGATIVE
Glucose, UA: NEGATIVE mg/dL
Ketones, ur: NEGATIVE mg/dL
Nitrite: NEGATIVE
Protein, ur: NEGATIVE mg/dL
Specific Gravity, Urine: 1.011 (ref 1.005–1.030)
Urobilinogen, UA: 0.2 mg/dL (ref 0.0–1.0)
pH: 7 (ref 5.0–8.0)

## 2014-01-08 LAB — URINE MICROSCOPIC-ADD ON

## 2014-01-08 LAB — PREGNANCY, URINE: Preg Test, Ur: NEGATIVE

## 2014-01-08 MED ORDER — SODIUM CHLORIDE 0.9 % IV BOLUS (SEPSIS): 1000.0000 mL | Freq: Once | INTRAVENOUS | Status: DC

## 2014-01-08 MED ORDER — FLUCONAZOLE 150 MG PO TABS: 150.0000 mg | ORAL_TABLET | Freq: Once | ORAL | Status: DC

## 2014-01-08 MED ORDER — SODIUM CHLORIDE 0.9 % IV BOLUS (SEPSIS)
2000.0000 mL | Freq: Once | INTRAVENOUS | Status: AC
  Administered 2014-01-08: 2000 mL via INTRAVENOUS

## 2014-01-08 MED ORDER — METRONIDAZOLE 500 MG PO TABS: 500.0000 mg | ORAL_TABLET | Freq: Two times a day (BID) | ORAL | Status: DC

## 2014-01-08 MED ORDER — ONDANSETRON HCL 4 MG/2ML IJ SOLN
4.0000 mg | Freq: Once | INTRAMUSCULAR | Status: AC
  Administered 2014-01-08: 4 mg via INTRAVENOUS
  Filled 2014-01-08: qty 2

## 2014-01-08 MED ORDER — MORPHINE SULFATE 4 MG/ML IJ SOLN
4.0000 mg | INTRAMUSCULAR | Status: DC | PRN
  Administered 2014-01-08: 4 mg via INTRAVENOUS
  Filled 2014-01-08: qty 1

## 2014-01-08 MED ORDER — GLYCOPYRROLATE 0.2 MG/ML IJ SOLN
0.1000 mg | Freq: Once | INTRAMUSCULAR | Status: AC
  Administered 2014-01-08: 0.1 mg via INTRAVENOUS
  Filled 2014-01-08: qty 1

## 2014-01-08 MED ORDER — HYDROCODONE-ACETAMINOPHEN 5-325 MG PO TABS: 1.0000 | ORAL_TABLET | ORAL | Status: DC | PRN

## 2014-01-08 MED ORDER — ACETAMINOPHEN 325 MG PO TABS
650.0000 mg | ORAL_TABLET | Freq: Once | ORAL | Status: AC
  Administered 2014-01-08: 650 mg via ORAL
  Filled 2014-01-08: qty 2

## 2014-01-08 MED ORDER — ONDANSETRON HCL 4 MG PO TABS: 4.0000 mg | ORAL_TABLET | Freq: Four times a day (QID) | ORAL | Status: DC

## 2014-01-08 MED ORDER — DICYCLOMINE HCL 20 MG PO TABS: 20.0000 mg | ORAL_TABLET | Freq: Two times a day (BID) | ORAL | Status: DC

## 2014-01-08 MED ORDER — SODIUM CHLORIDE 0.9 % IV SOLN
Freq: Once | INTRAVENOUS | Status: AC
  Administered 2014-01-08: 15:00:00 via INTRAVENOUS

## 2014-01-08 NOTE — ED Notes (Signed)
Fever Since Sunday.  Broke early week but has returned.  Shortness of breath since late last night.  Stools also have been loose since yesterday.

## 2014-01-08 NOTE — Discharge Instructions (Signed)
Abdominal Pain, Women °Abdominal (stomach, pelvic, or belly) pain can be caused by many things. It is important to tell your doctor: °· The location of the pain. °· Does it come and go or is it present all the time? °· Are there things that start the pain (eating certain foods, exercise)? °· Are there other symptoms associated with the pain (fever, nausea, vomiting, diarrhea)? °All of this is helpful to know when trying to find the cause of the pain. °CAUSES  °· Stomach: virus or bacteria infection, or ulcer. °· Intestine: appendicitis (inflamed appendix), regional ileitis (Crohn's disease), ulcerative colitis (inflamed colon), irritable bowel syndrome, diverticulitis (inflamed diverticulum of the colon), or cancer of the stomach or intestine. °· Gallbladder disease or stones in the gallbladder. °· Kidney disease, kidney stones, or infection. °· Pancreas infection or cancer. °· Fibromyalgia (pain disorder). °· Diseases of the female organs: °¨ Uterus: fibroid (non-cancerous) tumors or infection. °¨ Fallopian tubes: infection or tubal pregnancy. °¨ Ovary: cysts or tumors. °¨ Pelvic adhesions (scar tissue). °¨ Endometriosis (uterus lining tissue growing in the pelvis and on the pelvic organs). °¨ Pelvic congestion syndrome (female organs filling up with blood just before the menstrual period). °¨ Pain with the menstrual period. °¨ Pain with ovulation (producing an egg). °¨ Pain with an IUD (intrauterine device, birth control) in the uterus. °¨ Cancer of the female organs. °· Functional pain (pain not caused by a disease, may improve without treatment). °· Psychological pain. °· Depression. °DIAGNOSIS  °Your doctor will decide the seriousness of your pain by doing an examination. °· Blood tests. °· X-rays. °· Ultrasound. °· CT scan (computed tomography, special type of X-ray). °· MRI (magnetic resonance imaging). °· Cultures, for infection. °· Barium enema (dye inserted in the large intestine, to better view it with  X-rays). °· Colonoscopy (looking in intestine with a lighted tube). °· Laparoscopy (minor surgery, looking in abdomen with a lighted tube). °· Major abdominal exploratory surgery (looking in abdomen with a large incision). °TREATMENT  °The treatment will depend on the cause of the pain.  °· Many cases can be observed and treated at home. °· Over-the-counter medicines recommended by your caregiver. °· Prescription medicine. °· Antibiotics, for infection. °· Birth control pills, for painful periods or for ovulation pain. °· Hormone treatment, for endometriosis. °· Nerve blocking injections. °· Physical therapy. °· Antidepressants. °· Counseling with a psychologist or psychiatrist. °· Minor or major surgery. °HOME CARE INSTRUCTIONS  °· Do not take laxatives, unless directed by your caregiver. °· Take over-the-counter pain medicine only if ordered by your caregiver. Do not take aspirin because it can cause an upset stomach or bleeding. °· Try a clear liquid diet (broth or water) as ordered by your caregiver. Slowly move to a bland diet, as tolerated, if the pain is related to the stomach or intestine. °· Have a thermometer and take your temperature several times a day, and record it. °· Bed rest and sleep, if it helps the pain. °· Avoid sexual intercourse, if it causes pain. °· Avoid stressful situations. °· Keep your follow-up appointments and tests, as your caregiver orders. °· If the pain does not go away with medicine or surgery, you may try: °¨ Acupuncture. °¨ Relaxation exercises (yoga, meditation). °¨ Group therapy. °¨ Counseling. °SEEK MEDICAL CARE IF:  °· You notice certain foods cause stomach pain. °· Your home care treatment is not helping your pain. °· You need stronger pain medicine. °· You want your IUD removed. °· You feel faint or   lightheaded.  You develop nausea and vomiting.  You develop a rash.  You are having side effects or an allergy to your medicine. SEEK IMMEDIATE MEDICAL CARE IF:   Your  pain does not go away or gets worse.  You have a fever.  Your pain is felt only in portions of the abdomen. The right side could possibly be appendicitis. The left lower portion of the abdomen could be colitis or diverticulitis.  You are passing blood in your stools (bright red or black tarry stools, with or without vomiting).  You have blood in your urine.  You develop chills, with or without a fever.  You pass out. MAKE SURE YOU:   Understand these instructions.  Will watch your condition.  Will get help right away if you are not doing well or get worse. Document Released: 03/18/2007 Document Revised: 10/05/2013 Document Reviewed: 04/07/2009 [facility] Patient Information 2015 [place], [place]. This information is not intended to replace advice given to you by your health care provider. Make sure you discuss any questions you have with your health care provider.  Clostridium Difficile Infection Clostridium difficile (C. difficile) is a bacteria found in the intestinal tract or colon. Under certain conditions, it causes diarrhea and sometimes severe disease. The severe form of the disease is known as pseudomembranous colitis (often called C. difficile colitis). This disease can damage the lining of the colon or cause the colon to become enlarged (toxic megacolon). CAUSES Your colon normally contains many different bacteria, including C. difficile. The balance of bacteria in your colon can change during illness. This is especially true when you take antibiotic medicine. Taking antibiotics may allow the C. difficile to grow, multiply excessively, and make a toxin that then causes illness. The elderly and people with certain medical conditions have a greater risk of getting C. difficile infections. SYMPTOMS  Watery diarrhea.  Fever.  Fatigue.  Loss of appetite.  Nausea.  Abdominal swelling, pain, or tenderness.  Dehydration. DIAGNOSIS Your symptoms may make your caregiver  suspect a C. difficile infection, especially if you have used antibiotics in the preceding weeks. However, there are only 2 ways to know for certain whether you have a C. difficile infection:  A lab test that finds the toxin in your stool.  The specific appearance of an abnormality (pseudomembrane) in your colon. This can only be seen by doing a sigmoidoscopy or colonoscopy. These procedures involve passing an instrument through your rectum to look at the inside of your colon. Your caregiver will help determine if these tests are necessary. TREATMENT  Most people are successfully treated with one of two specific antibiotics, usually given by mouth. Other antibiotics you are receiving are stopped if possible.  Intravenous (IV) fluids and correction of electrolyte imbalance may be necessary.  Rarely, surgery may be needed to remove the infected part of the intestines.  Careful hand washing by you and your caregivers is important to prevent the spread of infection. In the hospital, your caregivers may also put on gowns and gloves to prevent the spread of the C. difficile bacteria. Your room is also cleaned regularly with a solution containing bleach or a product that is known to kill C. difficile. HOME CARE INSTRUCTIONS  Drink enough fluids to keep your urine clear or pale yellow. Avoid milk, caffeine, and alcohol.  Ask your caregiver for specific rehydration instructions.  Try eating small, frequent meals rather than large meals.  Take your antibiotics as directed. Finish them even if you start to feel  better.  Do not use medicines to slow diarrhea. This could delay healing or cause complications.  Wash your hands thoroughly after using the bathroom and before preparing food.  Make sure people who live with you wash their hands often, too.  Carefully disinfect all surfaces with a product that contains chlorine bleach. SEEK MEDICAL CARE IF:  Diarrhea persists longer than expected or  recurs after completing your course of antibiotic treatment for the C. difficile infection.  You have trouble staying hydrated. SEEK IMMEDIATE MEDICAL CARE IF:  You develop a new fever.  You have increasing abdominal pain or tenderness.  There is blood in your stools, or your stools are dark black and tarry.  You cannot hold down food or liquids. MAKE SURE YOU:  Understand these instructions.  Will watch your condition.  Will get help right away if you are not doing well or get worse. Document Released: 02/28/2005 Document Revised: 10/05/2013 Document Reviewed: 10/27/2010 [facility] Patient Information 2015 [place], [place]. This information is not intended to replace advice given to you by your health care provider. Make sure you discuss any questions you have with your health care provider.

## 2014-01-08 NOTE — ED Notes (Signed)
Patient is aware that a urine sample is needed, will try when able.

## 2014-01-08 NOTE — ED Provider Notes (Signed)
CSN: [id number]     Arrival date & time 01/08/14  1303 History   First MD Initiated Contact with Patient 01/08/14 1314     Chief Complaint  Patient presents with  . Shortness of Breath  . Fever  . Diarrhea      HPI Pt  presents with abdominal pain, cramps, fever and diarrhea. Symptoms for the last 10 days. Started to improve for 5 days ago before recurring. Up to 101 at home. Frequent diarrhea. No blood. States she is cramping so bad this morning that she states that it "took my breath away". Pt seen at UC 21 days ago and given Clindamycin for otitis media.  Past Medical History  Diagnosis Date  . Bipolar 1 disorder   . Menometrorrhagia 03/23/2011  . Depression   . Depression 03/23/2011  . Hypertension   . Anxiety   . Obesity    Past Surgical History  Procedure Laterality Date  . Wisdom tooth extraction     Family History  Problem Relation Age of Onset  . Hypertension Mother    History  Substance Use Topics  . Smoking status: Never Smoker   . Smokeless tobacco: Never Used  . Alcohol Use: 0.0 oz/week     Comment: drink socially   OB History   Grav Para Term Preterm Abortions TAB SAB Ect Mult Living   2 1 1  1  0 1 0 0 1     Review of Systems  Constitutional: Positive for fever and fatigue. Negative for chills, diaphoresis and appetite change.  HENT: Negative for mouth sores, sore throat and trouble swallowing.   Eyes: Negative for visual disturbance.  Respiratory: Negative for cough, chest tightness, shortness of breath and wheezing.   Cardiovascular: Negative for chest pain.  Gastrointestinal: Positive for nausea, vomiting, abdominal pain and diarrhea. Negative for abdominal distention.  Endocrine: Negative for polydipsia, polyphagia and polyuria.  Genitourinary: Negative for dysuria, frequency and hematuria.  Musculoskeletal: Negative for gait problem.  Skin: Negative for color change, pallor and rash.  Neurological: Negative for dizziness, syncope,  light-headedness and headaches.  Hematological: Does not bruise/bleed easily.  Psychiatric/Behavioral: Negative for behavioral problems and confusion.      Allergies  Cauliflower; Demerol; and Sulfa drugs cross reactors  Home Medications   Prior to Admission medications   Medication Sig Start Date End Date Taking? Authorizing Provider  ciprofloxacin-dexamethasone (CIPRODEX) otic suspension Place 4 drops into the right ear 2 (two) times daily.   Yes Historical Provider, MD  citalopram (CELEXA) 40 MG tablet Take 40 mg by mouth daily.     Yes Historical Provider, MD  ClonazePAM (KLONOPIN PO) Take 0.5 mg by mouth as needed (anxiety).    Yes Historical Provider, MD  ibuprofen (ADVIL,MOTRIN) 200 MG tablet Take 200 mg by mouth every 6 (six) hours as needed.     Yes Historical Provider, MD  levonorgestrel (MIRENA) 20 MCG/24HR IUD 1 each by Intrauterine route once.   Yes Historical Provider, MD  oxyCODONE-acetaminophen (PERCOCET/ROXICET) 5-325 MG per tablet Take 1-2 tablets by mouth every 4 (four) hours as needed for moderate pain or severe pain.   Yes Historical Provider, MD  dicyclomine (BENTYL) 20 MG tablet Take 1 tablet (20 mg total) by mouth 2 (two) times daily. 01/08/14   Tanna Furry, MD  HYDROcodone-acetaminophen (NORCO/VICODIN) 5-325 MG per tablet Take 1 tablet by mouth every 4 (four) hours as needed. 01/08/14   Tanna Furry, MD  metroNIDAZOLE (FLAGYL) 500 MG tablet Take 1 tablet (500 mg total) by  mouth 2 (two) times daily. 01/08/14   Tanna Furry, MD  ondansetron (ZOFRAN) 4 MG tablet Take 1 tablet (4 mg total) by mouth every 6 (six) hours. 01/08/14   Tanna Furry, MD   BP 128/75  Pulse 101  Temp(Src) 100.4 F (38 C) (Oral)  Resp 20  SpO2 96% Physical Exam  Constitutional: She is oriented to person, place, and time. She appears well-developed and well-nourished. No distress.  HENT:  Head: Normocephalic.  Eyes: Conjunctivae are normal. Pupils are equal, round, and reactive to light. No scleral  icterus.  Neck: Normal range of motion. Neck supple. No thyromegaly present.  Cardiovascular: Normal rate and regular rhythm.  Exam reveals no gallop and no friction rub.   No murmur heard. Pulmonary/Chest: Effort normal and breath sounds normal. No respiratory distress. She has no wheezes. She has no rales.  Abdominal: Soft. Bowel sounds are normal. She exhibits no distension. There is no tenderness. There is no rebound.  Mild diffuse tenderness. Normal active bowel sounds. No peritoneal irritation.  Musculoskeletal: Normal range of motion.  Neurological: She is alert and oriented to person, place, and time.  Skin: Skin is warm and dry. No rash noted.  Psychiatric: She has a normal mood and affect. Her behavior is normal.    ED Course  Procedures (including critical care time) Labs Review Labs Reviewed  COMPREHENSIVE METABOLIC PANEL - Abnormal; Notable for the following:    BUN 5 (*)    Albumin 3.0 (*)    Total Bilirubin 0.2 (*)    GFR calc non Af Amer 68 (*)    GFR calc Af Amer 79 (*)    All other components within normal limits  CBC WITH DIFFERENTIAL  URINALYSIS, ROUTINE W REFLEX MICROSCOPIC  PREGNANCY, URINE    Imaging Review Dg Chest 2 View  01/08/2014   CLINICAL DATA:  Shortness of breath, chest pain, abdominal pain, fever, headache, and diarrhea.  EXAM: CHEST  2 VIEW  COMPARISON:  None.  FINDINGS: The cardiomediastinal silhouette is within normal limits. The lungs are mildly hypoinflated with slight elevation of the right hemidiaphragm. No confluent airspace opacity, pulmonary edema, pleural effusion, or pneumothorax is identified. No acute osseous abnormality is seen.  IMPRESSION: No active cardiopulmonary disease.   Electronically Signed   By: Logan Bores   On: 01/08/2014 13:51     EKG Interpretation None      MDM   Final diagnoses:  Generalized abdominal pain  C. difficile colitis    History and symptoms consistent with a C. difficile enterocolitis. Labs are  reassuring. She had IV fluids, Robinul. Pregnancy test and urine are pending. History is consistent for C. difficile with a recent clindamycin, and  symptoms as above.    Tanna Furry, MD 01/08/14 [phone]

## 2014-01-08 NOTE — ED Provider Notes (Signed)
UA not grossly infected, POC preg negative. Pt will be d/c'd home per Dr. Jeneen Rinks DC instructions w/ tx for Cl diff w/ Flagyl.   1. Generalized abdominal pain   2. C. difficile colitis      Ernestina Patches, MD 01/08/14 1655

## 2014-01-08 NOTE — ED Notes (Signed)
Pt would like added labs pulled from her line.  RN aware.  Pt knows i will be back to stick her if RN cant pull anything from line.

## 2014-01-12 ENCOUNTER — Encounter (HOSPITAL_COMMUNITY): Payer: Self-pay | Admitting: Emergency Medicine

## 2014-01-12 ENCOUNTER — Ambulatory Visit (INDEPENDENT_AMBULATORY_CARE_PROVIDER_SITE_OTHER): Payer: BC Managed Care – PPO | Admitting: Family Medicine

## 2014-01-12 ENCOUNTER — Emergency Department (HOSPITAL_COMMUNITY)
Admission: EM | Admit: 2014-01-12 | Discharge: 2014-01-13 | Disposition: A | Discharge status: Home / Self Care | Payer: BC Managed Care – PPO | Attending: Emergency Medicine | Admitting: Emergency Medicine

## 2014-01-12 VITALS — BP 133/87 | HR 101 | Temp 101.0°F | Resp 24 | Ht 65.0 in | Wt 240.0 lb

## 2014-01-12 DIAGNOSIS — R509 Fever, unspecified: Secondary | ICD-10-CM

## 2014-01-12 DIAGNOSIS — K5289 Other specified noninfective gastroenteritis and colitis: Secondary | ICD-10-CM | POA: Diagnosis not present

## 2014-01-12 DIAGNOSIS — E669 Obesity, unspecified: Secondary | ICD-10-CM | POA: Diagnosis not present

## 2014-01-12 DIAGNOSIS — I10 Essential (primary) hypertension: Secondary | ICD-10-CM | POA: Insufficient documentation

## 2014-01-12 DIAGNOSIS — K529 Noninfective gastroenteritis and colitis, unspecified: Secondary | ICD-10-CM

## 2014-01-12 LAB — POCT UA - MICROSCOPIC ONLY
Casts, Ur, LPF, POC: NEGATIVE
Crystals, Ur, HPF, POC: NEGATIVE
Yeast, UA: NEGATIVE

## 2014-01-12 LAB — POCT URINALYSIS DIPSTICK
Glucose, UA: NEGATIVE
Ketones, UA: 15
Nitrite, UA: POSITIVE
Protein, UA: 100
Spec Grav, UA: 1.015
Urobilinogen, UA: 0.2
pH, UA: 7

## 2014-01-12 LAB — POCT CBC
Granulocyte percent: 65.4 %G (ref 37–80)
HCT, POC: 36.8 % — AB (ref 37.7–47.9)
Hemoglobin: 11.9 g/dL — AB (ref 12.2–16.2)
Lymph, poc: 2.3 (ref 0.6–3.4)
MCH, POC: 28.7 pg (ref 27–31.2)
MCHC: 32.3 g/dL (ref 31.8–35.4)
MCV: 88.9 fL (ref 80–97)
MID (cbc): 0.2 (ref 0–0.9)
MPV: 8.4 fL (ref 0–99.8)
POC Granulocyte: 4.7 (ref 2–6.9)
POC LYMPH PERCENT: 31.6 %L (ref 10–50)
POC MID %: 3 %M (ref 0–12)
Platelet Count, POC: 258 10*3/uL (ref 142–424)
RBC: 4.13 M/uL (ref 4.04–5.48)
RDW, POC: 14.7 %
WBC: 7.2 10*3/uL (ref 4.6–10.2)

## 2014-01-12 LAB — COMPREHENSIVE METABOLIC PANEL
ALT: 30 U/L (ref 0–35)
AST: 23 U/L (ref 0–37)
Albumin: 2.8 g/dL — ABNORMAL LOW (ref 3.5–5.2)
Alkaline Phosphatase: 123 U/L — ABNORMAL HIGH (ref 39–117)
Anion gap: 15 (ref 5–15)
BUN: 7 mg/dL (ref 6–23)
CO2: 24 mEq/L (ref 19–32)
Calcium: 8.7 mg/dL (ref 8.4–10.5)
Chloride: 99 mEq/L (ref 96–112)
Creatinine, Ser: 1.15 mg/dL — ABNORMAL HIGH (ref 0.50–1.10)
GFR calc Af Amer: 69 mL/min — ABNORMAL LOW (ref >90)
GFR calc non Af Amer: 60 mL/min — ABNORMAL LOW (ref >90)
Glucose, Bld: 97 mg/dL (ref 70–99)
Potassium: 3 mEq/L — ABNORMAL LOW (ref 3.7–5.3)
Sodium: 138 mEq/L (ref 137–147)
Total Bilirubin: 0.2 mg/dL — ABNORMAL LOW (ref 0.3–1.2)
Total Protein: 7.8 g/dL (ref 6.0–8.3)

## 2014-01-12 LAB — LIPASE, BLOOD: Lipase: 42 U/L (ref 11–59)

## 2014-01-12 NOTE — ED Notes (Signed)
Pt. reports intermittent fever since Aug.2,2015 seen at [facility] today blood tests and urine test done , advised to go to ER for further evaluation .

## 2014-01-12 NOTE — Progress Notes (Signed)
Subjective:    Patient ID: Teresa Herrera, female    DOB: 1975/12/06, 38 y.o.   MRN: [id number]  HPI Teresa Herrera is a 38 yo female who presents with persistent fevers and recent diarrheal illness. Onset July 11th, when her symptoms started with a boil in her ear at [facility]) on July 18th, started Cipo-dex otic suspension and a PO antibiotic (unknown which one). -Followed up with ENT the Tuesday after the UC visit, dx with fungal ear infection, started on drops. -1 week later- follow-up again with ENT- started on a different drop (unknown). She was still taking the PO antibiotic. -Approximately 2 weeks ago, August 2nd, she noticed onset of fevers, chills, fatigue, malaise, and acute diarrhea that was explosive watery, loose, yellow, not very malodorous initially.  Friday August 7th: Went to [facility] ED- told she has C. Diff colitis (started on metronidazole. Taking Percocet for severe pain or ibuprofen for fever. Diarrhea stopped as of yesterday AM, but has not had a bowel movement since.  Today, Continues to have daily fever (Tmax 102.9 yesterday or day before- since starting metronidazole 4 days ago)  +Dysuria, hematuria, urgency, decreased UOP, chills, anorexia, low back pain. Trying to drink plenty of fluids Notes associated neck pain and headache, though no photophobia, persistent vomiting, or confusion.  Travel Hx: No travel abroad or camping in the woods. No known sick contacts.  Review of Systems Blood in stools- neg Abdominal pain+ (generalized, diffuse) Dysuria+ Darker urine+ Painful gas- neg Vomiting x1 (2 days ago), following taking zofran, metronidazole, and percocet Photophobia Loss of appetite+  Review of systems as per history of present illness. Remainder of 11 point review of systems was performed and is otherwise negative. Past Medical History  Diagnosis Date  . Bipolar 1 disorder   . Menometrorrhagia 03/23/2011  . Depression   . Depression  03/23/2011  . Hypertension   . Anxiety   . Obesity    History   Social History  . Marital Status: Single    Spouse Name: N/A    Number of Children: N/A  . Years of Education: N/A   Social History Main Topics  . Smoking status: Never Smoker   . Smokeless tobacco: Never Used  . Alcohol Use: 0.0 oz/week     Comment: drink socially  . Drug Use: No  . Sexual Activity: Not Currently    Partners: Male    Birth Control/ Protection: Inserts     Comment: Nuvo Ring   Other Topics Concern  . None   Social History Narrative  . None   Family History  Problem Relation Age of Onset  . Hypertension Mother   . Heart disease Maternal Grandmother   . Hyperlipidemia Maternal Grandmother   . Hypertension Maternal Grandmother        Objective:   Physical Exam BP 133/87  Pulse 101  Temp(Src) 101 F (38.3 C) (Oral)  Resp 24  Ht 5\' 5"  (1.651 m)  Wt 240 lb (108.863 kg)  BMI 39.94 kg/m2  SpO2 98% General appearance: alert, cooperative, fatigued, flushed and no distress Head: Normocephalic, without obvious abnormality, atraumatic Eyes: negative findings: lids and lashes normal, conjunctivae and sclerae normal and pupils equal, round, reactive to light and accomodation, positive findings: none Ears: normal TM's and external ear canals both ears Nose: Nares normal. Septum midline. Mucosa normal. No drainage or sinus tenderness. Throat: lips, mucosa, and tongue normal; teeth and gums normal Neck: no adenopathy, no carotid bruit, no JVD, supple,  symmetrical, trachea midline and thyroid not enlarged, symmetric, no tenderness/mass/nodules Back: TTP bilateral lower back paraspinal musculature Lungs: clear to auscultation bilaterally and normal percussion bilaterally Heart: S1, S2 normal, no S3 or S4, no click, no rub and regular rhythm, tachycardic rate Abdomen: normal findings: liver span normal to percussion, no masses palpable and soft and abnormal findings:  hypoactive bowel sounds and  +Murphy's sign. Mild diffuse tenderness, no guarding or rigidity. No tenderness at Mcburney's point Extremities: no edema, redness or tenderness in the calves or thighs and no ulcers, gangrene or trophic changes Pulses:  L brachial 2+ R brachial 2+  L radial 2+ R radial 2+  L inguinal 2+ R inguinal 2+  L popliteal 2+ R popliteal 2+  L posterior tibial 2+ R posterior tibial 2+  L dorsalis pedis 2+ R dorsalis pedis 2+   Skin: nails clear without discoloration or sign of infection, no edema, no evidence of bleeding or bruising and no lesions noted or mildly decreased turgor Lymph nodes: Inguinal adenopathy: shotty, soft Neurologic: Alert and oriented X 3, normal strength and tone. Normal symmetric reflexes. Normal coordination and gait Mental status: Alert, oriented, thought content appropriate Cranial nerves: normal Sensory: normal Motor: grossly normal Reflexes: 2+ and symmetric Coordination: no meningeal signs  Urine dipstick shows positive for WBC's, positive for RBC's and positive for nitrates.  Micro exam: 10-15 WBC's per HPF, 10-15 RBC's per HPF and 1+ bacteria.      Assessment & Plan:  1. Fever of unknown origin.  DDx Includes: Upper urinary tract infection vs. Complicated Colitis vs. Cholecystitis vs. Ascending Colangitis vs. Less likely pancreatitis vs. Diverticulitis.  -UA with sx of fever concerning for upper urinary tract infection -Would recommend CT Abd/Pelvis and further lab testing such as CMP, lipase, Bili panel to further describe source of fever -Patient requires further IV hydration -Discussed with [facility], who accepted patient for more intensive work-up and treatment.  Dr. Linnell Fulling, Fellow PGY-4  Discussed with Dr. Carlota Raspberry, attending.

## 2014-01-13 ENCOUNTER — Emergency Department (HOSPITAL_COMMUNITY): Payer: BC Managed Care – PPO

## 2014-01-13 ENCOUNTER — Encounter (HOSPITAL_COMMUNITY): Payer: Self-pay

## 2014-01-13 LAB — CBC WITH DIFFERENTIAL/PLATELET
Basophils Absolute: 0 10*3/uL (ref 0.0–0.1)
Basophils Relative: 0 % (ref 0–1)
Eosinophils Absolute: 0 10*3/uL (ref 0.0–0.7)
Eosinophils Relative: 0 % (ref 0–5)
HCT: 34.9 % — ABNORMAL LOW (ref 36.0–46.0)
Hemoglobin: 11.8 g/dL — ABNORMAL LOW (ref 12.0–15.0)
Lymphocytes Relative: 34 % (ref 12–46)
Lymphs Abs: 2.2 10*3/uL (ref 0.7–4.0)
MCH: 29.3 pg (ref 26.0–34.0)
MCHC: 33.8 g/dL (ref 30.0–36.0)
MCV: 86.6 fL (ref 78.0–100.0)
Monocytes Absolute: 0.7 10*3/uL (ref 0.1–1.0)
Monocytes Relative: 11 % (ref 3–12)
Neutro Abs: 3.7 10*3/uL (ref 1.7–7.7)
Neutrophils Relative %: 55 % (ref 43–77)
Platelets: 254 10*3/uL (ref 150–400)
RBC: 4.03 MIL/uL (ref 3.87–5.11)
RDW: 13.9 % (ref 11.5–15.5)
WBC: 6.6 10*3/uL (ref 4.0–10.5)

## 2014-01-13 LAB — URINALYSIS W MICROSCOPIC (NOT AT ARMC)
Bilirubin Urine: NEGATIVE
Glucose, UA: NEGATIVE mg/dL
Ketones, ur: 15 mg/dL — AB
Nitrite: NEGATIVE
Protein, ur: 30 mg/dL — AB
Specific Gravity, Urine: 1.014 (ref 1.005–1.030)
Urobilinogen, UA: 0.2 mg/dL (ref 0.0–1.0)
pH: 7 (ref 5.0–8.0)

## 2014-01-13 MED ORDER — IOHEXOL 300 MG/ML  SOLN
100.0000 mL | Freq: Once | INTRAMUSCULAR | Status: AC | PRN
  Administered 2014-01-13: 100 mL via INTRAVENOUS

## 2014-01-13 MED ORDER — ONDANSETRON HCL 4 MG/2ML IJ SOLN
INTRAMUSCULAR | Status: AC
  Filled 2014-01-13: qty 2

## 2014-01-13 NOTE — Progress Notes (Addendum)
Patient discussed with Dr. Linnell Fulling. Agree with assessment and plan of care per his note, and need for further eval of persisitent fevers, as well as possible fluid replacement.

## 2014-01-13 NOTE — ED Notes (Signed)
System downtime, check paper charting.

## 2014-01-13 NOTE — ED Notes (Signed)
Pt back from CT

## 2014-01-13 NOTE — Discharge Instructions (Signed)
As we discussed to call GI medicine for followup. Continue your Flagyl. Can take Tylenol as needed for the fever. CT scan is just above a a sending colitis. This could be related to inflammatory bowel disease. Return for any newer worse symptoms. Return for worsening of the diarrhea or worse fevers. Or worse abdominal pain.

## 2014-01-13 NOTE — ED Notes (Signed)
Patient in NAD at time of d/c

## 2014-01-14 LAB — URINE CULTURE
Colony Count: 40000
Colony Count: 2000

## 2014-03-03 NOTE — ED Provider Notes (Signed)
CSN: [id number]     Arrival date & time 01/12/14  2057 History   First MD Initiated Contact with Patient 01/13/14 0023     Chief Complaint  Patient presents with  . Fever     (Consider location/radiation/quality/duration/timing/severity/associated sxs/prior Treatment) Patient is a 38 y.o. female presenting with fever. The history is provided by the patient.  Fever Associated symptoms: chills, diarrhea, dysuria, nausea and vomiting   Associated symptoms: no chest pain, no confusion, no congestion, no headaches and no rash   History of fever since 8/2. Treated with clindamycin 7/15 for ear infection, but med list suggest it may have been ciprodex otic suspension. Diarrhea started 7/29. Seen at [facility] ED 8/7 and Rx with flagyl for presumed C-diff infection. Fevers persist. Diarrhea better. Today seen at Urgent Care with abnormal UA. Abdominal pains suprapubic and back. 8//10 in pain. Ache and cramp like in nature. Associated with nausea vomiting.   Past Medical History  Diagnosis Date  . Bipolar 1 disorder   . Menometrorrhagia 03/23/2011  . Depression   . Depression 03/23/2011  . Hypertension   . Anxiety   . Obesity    Past Surgical History  Procedure Laterality Date  . Wisdom tooth extraction     Family History  Problem Relation Age of Onset  . Hypertension Mother   . Heart disease Maternal Grandmother   . Hyperlipidemia Maternal Grandmother   . Hypertension Maternal Grandmother    History  Substance Use Topics  . Smoking status: Never Smoker   . Smokeless tobacco: Never Used  . Alcohol Use: 0.0 oz/week     Comment: drink socially   OB History   Grav Para Term Preterm Abortions TAB SAB Ect Mult Living   2 1 1  1  0 1 0 0 1     Review of Systems  Constitutional: Positive for fever and chills.  HENT: Negative for congestion.   Eyes: Negative for visual disturbance.  Respiratory: Negative for shortness of breath.   Cardiovascular: Negative for chest pain.   Gastrointestinal: Positive for nausea, vomiting, abdominal pain and diarrhea. Negative for blood in stool.  Genitourinary: Positive for dysuria.  Musculoskeletal: Positive for back pain.  Skin: Negative for rash.  Neurological: Negative for headaches.  Hematological: Does not bruise/bleed easily.  Psychiatric/Behavioral: Negative for confusion.      Allergies  Cauliflower; Demerol; and Sulfa drugs cross reactors  Home Medications   Prior to Admission medications   Medication Sig Start Date End Date Taking? Authorizing Provider  ciprofloxacin-dexamethasone (CIPRODEX) otic suspension Place 4 drops into the right ear 2 (two) times daily.   Yes Historical Provider, MD  clonazePAM (KLONOPIN) 0.5 MG tablet Take 0.5 mg by mouth daily as needed for anxiety.   Yes Historical Provider, MD  ibuprofen (ADVIL,MOTRIN) 200 MG tablet Take 200 mg by mouth every 6 (six) hours as needed for mild pain.    Yes Historical Provider, MD  levonorgestrel (MIRENA) 20 MCG/24HR IUD 1 each by Intrauterine route once.   Yes Historical Provider, MD  metroNIDAZOLE (FLAGYL) 500 MG tablet Take 500 mg by mouth 2 (two) times daily.   Yes Historical Provider, MD  naproxen sodium (ANAPROX) 220 MG tablet Take 220 mg by mouth 2 (two) times daily as needed (for pain).   Yes Historical Provider, MD  ondansetron (ZOFRAN) 4 MG tablet Take 4 mg by mouth every 8 (eight) hours as needed for nausea or vomiting.   Yes Historical Provider, MD  oxyCODONE-acetaminophen (PERCOCET/ROXICET) 5-325 MG per tablet Take  1-2 tablets by mouth every 4 (four) hours as needed for moderate pain or severe pain.   Yes Historical Provider, MD  ranitidine (ZANTAC) 150 MG tablet Take 150 mg by mouth 2 (two) times daily as needed for heartburn.   Yes Historical Provider, MD   BP 127/68  Pulse 85  Temp(Src) 98.7 F (37.1 C) (Oral)  Resp 25  Ht 5\' 4"  (1.626 m)  Wt 240 lb (108.863 kg)  BMI 41.18 kg/m2  SpO2 98% Physical Exam  Nursing note and vitals  reviewed. Constitutional: She is oriented to person, place, and time. She appears well-developed and well-nourished. No distress.  HENT:  Head: Normocephalic and atraumatic.  Mouth/Throat: Oropharynx is clear and moist.  Eyes: Conjunctivae and EOM are normal. Pupils are equal, round, and reactive to light.  Neck: Normal range of motion.  Cardiovascular: Normal rate and regular rhythm.   No murmur heard. Pulmonary/Chest: Effort normal and breath sounds normal. No respiratory distress.  Abdominal: Soft. Bowel sounds are normal. There is tenderness.  Tender bilateral LQ and suprapubic  Musculoskeletal: Normal range of motion. She exhibits tenderness.  Tender right CVA  Neurological: She is alert and oriented to person, place, and time. No cranial nerve deficit. She exhibits normal muscle tone. Coordination normal.  Skin: Skin is warm. No rash noted.    ED Course  Procedures (including critical care time) Labs Review Labs Reviewed  COMPREHENSIVE METABOLIC PANEL - Abnormal; Notable for the following:    Potassium 3.0 (*)    Creatinine, Ser 1.15 (*)    Albumin 2.8 (*)    Alkaline Phosphatase 123 (*)    Total Bilirubin 0.2 (*)    GFR calc non Af Amer 60 (*)    GFR calc Af Amer 69 (*)    All other components within normal limits  CBC WITH DIFFERENTIAL - Abnormal; Notable for the following:    Hemoglobin 11.8 (*)    HCT 34.9 (*)    All other components within normal limits  URINALYSIS W MICROSCOPIC - Abnormal; Notable for the following:    APPearance CLOUDY (*)    Hgb urine dipstick MODERATE (*)    Ketones, ur 15 (*)    Protein, ur 30 (*)    Leukocytes, UA TRACE (*)    Squamous Epithelial / LPF FEW (*)    All other components within normal limits  URINE CULTURE  LIPASE, BLOOD   Results for orders placed during the hospital encounter of 01/12/14  URINE CULTURE      Result Value Ref Range   Specimen Description URINE, RANDOM     Special Requests NONE     Culture  Setup Time        Value: 01/13/2014 08:46     Performed at [facility] Count       Value: 2,000 COLONIES/ML     Performed at [facility]   Culture       Value: INSIGNIFICANT GROWTH     Performed at [facility]   Report Status 01/14/2014 FINAL    COMPREHENSIVE METABOLIC PANEL      Result Value Ref Range   Sodium 138  137 - 147 mEq/L   Potassium 3.0 (*) 3.7 - 5.3 mEq/L   Chloride 99  96 - 112 mEq/L   CO2 24  19 - 32 mEq/L   Glucose, Bld 97  70 - 99 mg/dL   BUN 7  6 - 23 mg/dL   Creatinine, Ser 1.15 (*)  0.50 - 1.10 mg/dL   Calcium 8.7  8.4 - 10.5 mg/dL   Total Protein 7.8  6.0 - 8.3 g/dL   Albumin 2.8 (*) 3.5 - 5.2 g/dL   AST 23  0 - 37 U/L   ALT 30  0 - 35 U/L   Alkaline Phosphatase 123 (*) 39 - 117 U/L   Total Bilirubin 0.2 (*) 0.3 - 1.2 mg/dL   GFR calc non Af Amer 60 (*) >90 mL/min   GFR calc Af Amer 69 (*) >90 mL/min   Anion gap 15  5 - 15  LIPASE, BLOOD      Result Value Ref Range   Lipase 42  11 - 59 U/L  CBC WITH DIFFERENTIAL      Result Value Ref Range   WBC 6.6  4.0 - 10.5 K/uL   RBC 4.03  3.87 - 5.11 MIL/uL   Hemoglobin 11.8 (*) 12.0 - 15.0 g/dL   HCT 34.9 (*) 36.0 - 46.0 %   MCV 86.6  78.0 - 100.0 fL   MCH 29.3  26.0 - 34.0 pg   MCHC 33.8  30.0 - 36.0 g/dL   RDW 13.9  11.5 - 15.5 %   Platelets 254  150 - 400 K/uL   Neutrophils Relative % 55  43 - 77 %   Lymphocytes Relative 34  12 - 46 %   Monocytes Relative 11  3 - 12 %   Eosinophils Relative 0  0 - 5 %   Basophils Relative 0  0 - 1 %   Neutro Abs 3.7  1.7 - 7.7 K/uL   Lymphs Abs 2.2  0.7 - 4.0 K/uL   Monocytes Absolute 0.7  0.1 - 1.0 K/uL   Eosinophils Absolute 0.0  0.0 - 0.7 K/uL   Basophils Absolute 0.0  0.0 - 0.1 K/uL   Smear Review MORPHOLOGY UNREMARKABLE    URINALYSIS W MICROSCOPIC      Result Value Ref Range   Color, Urine YELLOW  YELLOW   APPearance CLOUDY (*) CLEAR   Specific Gravity, Urine 1.014  1.005 - 1.030   pH 7.0  5.0 - 8.0   Glucose, UA NEGATIVE  NEGATIVE  mg/dL   Hgb urine dipstick MODERATE (*) NEGATIVE   Bilirubin Urine NEGATIVE  NEGATIVE   Ketones, ur 15 (*) NEGATIVE mg/dL   Protein, ur 30 (*) NEGATIVE mg/dL   Urobilinogen, UA 0.2  0.0 - 1.0 mg/dL   Nitrite NEGATIVE  NEGATIVE   Leukocytes, UA TRACE (*) NEGATIVE   WBC, UA 3-6  <3 WBC/hpf   RBC / HPF 3-6  <3 RBC/hpf   Bacteria, UA RARE  RARE   Squamous Epithelial / LPF FEW (*) RARE     Imaging Review No results found. CT Abd/pelvis: Focal wall thickening of the ascending colon inflammatory changes, could reflect colitis but underlying neoplasm not excluded.       EKG Interpretation None      MDM   Final diagnoses:  Colitis    Possible UTI cultures sent but patient already on antibiotics by Urgent Care for this. Will need colonoscopy to further evaluate CT findings to evaluate colitis which could be infectious or inflammatory like Crohns or possible neoplasm. Patient will follow up for colonoscopy. Mild hypokalemia noted and diet recommendations provided.     Fredia Sorrow, MD 03/03/14 1235

## 2014-04-05 ENCOUNTER — Encounter (HOSPITAL_COMMUNITY): Payer: Self-pay

## 2014-04-13 ENCOUNTER — Ambulatory Visit (INDEPENDENT_AMBULATORY_CARE_PROVIDER_SITE_OTHER): Payer: BC Managed Care – PPO | Admitting: Family Medicine

## 2014-04-13 ENCOUNTER — Encounter: Payer: Self-pay | Admitting: Family Medicine

## 2014-04-13 VITALS — BP 120/80 | HR 86 | Temp 98.8°F | Resp 18 | Wt 238.0 lb

## 2014-04-13 DIAGNOSIS — Z6841 Body Mass Index (BMI) 40.0 and over, adult: Secondary | ICD-10-CM

## 2014-04-13 DIAGNOSIS — Z0000 Encounter for general adult medical examination without abnormal findings: Secondary | ICD-10-CM

## 2014-04-13 DIAGNOSIS — J309 Allergic rhinitis, unspecified: Secondary | ICD-10-CM

## 2014-04-13 DIAGNOSIS — R609 Edema, unspecified: Secondary | ICD-10-CM

## 2014-04-13 DIAGNOSIS — F32A Depression, unspecified: Secondary | ICD-10-CM

## 2014-04-13 DIAGNOSIS — Z23 Encounter for immunization: Secondary | ICD-10-CM

## 2014-04-13 DIAGNOSIS — F329 Major depressive disorder, single episode, unspecified: Secondary | ICD-10-CM

## 2014-04-13 LAB — POCT WET PREP WITH KOH
KOH Prep POC: NEGATIVE
Trichomonas, UA: NEGATIVE
Yeast Wet Prep HPF POC: NEGATIVE

## 2014-04-13 LAB — POCT URINALYSIS DIPSTICK
Bilirubin, UA: NEGATIVE
Glucose, UA: NEGATIVE
Ketones, UA: NEGATIVE
Nitrite, UA: NEGATIVE
Protein, UA: 30
Spec Grav, UA: 1.015
Urobilinogen, UA: 0.2
pH, UA: 7

## 2014-04-13 LAB — COMPLETE METABOLIC PANEL WITH GFR
ALT: 19 U/L (ref 0–35)
AST: 15 U/L (ref 0–37)
Albumin: 3.7 g/dL (ref 3.5–5.2)
Alkaline Phosphatase: 88 U/L (ref 39–117)
BUN: 9 mg/dL (ref 6–23)
CO2: 25 mEq/L (ref 19–32)
Calcium: 9 mg/dL (ref 8.4–10.5)
Chloride: 106 mEq/L (ref 96–112)
Creat: 0.97 mg/dL (ref 0.50–1.10)
GFR, Est African American: 85 mL/min
GFR, Est Non African American: 74 mL/min
Glucose, Bld: 89 mg/dL (ref 70–99)
Potassium: 4.2 mEq/L (ref 3.5–5.3)
Sodium: 139 mEq/L (ref 135–145)
Total Bilirubin: 0.4 mg/dL (ref 0.2–1.2)
Total Protein: 7.2 g/dL (ref 6.0–8.3)

## 2014-04-13 LAB — LIPID PANEL
Cholesterol: 170 mg/dL (ref 0–200)
HDL: 40 mg/dL (ref >39)
LDL Cholesterol: 114 mg/dL — ABNORMAL HIGH (ref 0–99)
Total CHOL/HDL Ratio: 4.3 Ratio
Triglycerides: 82 mg/dL (ref <150)
VLDL: 16 mg/dL (ref 0–40)

## 2014-04-13 LAB — THYROID PANEL WITH TSH
Free Thyroxine Index: 2.4 (ref 1.4–3.8)
T3 Uptake: 30 % (ref 22–35)
T4, Total: 7.9 ug/dL (ref 4.5–12.0)
TSH: 0.274 u[IU]/mL — ABNORMAL LOW (ref 0.350–4.500)

## 2014-04-13 MED ORDER — BUDESONIDE 32 MCG/ACT NA SUSP: 2.0000 | Freq: Every day | NASAL | Status: AC

## 2014-04-13 MED ORDER — HYDROCHLOROTHIAZIDE 12.5 MG PO CAPS: 12.5000 mg | ORAL_CAPSULE | Freq: Every day | ORAL | Status: DC

## 2014-04-13 NOTE — Progress Notes (Signed)
Subjective:    Patient ID: Teresa Herrera, female    DOB: June 24, 1975, 38 y.o.   MRN: [id number]  HPI Patient presents today to establish primary care. She has noticed swelling in her legs and hands for several years and is concerned that her blood pressure is high. The swelling is worse as the day goes on and she can also have swelling upon awakening. She has stopped drinking soda which has helped with her swelling.   She has been working to decrease her sodium intake and avoids eating out. Has lost 1 pound in the last month. Prior to that lost about 10 pounds. She has gone down a size in her clothes.   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   She has frequent (1-2x week) headaches which are usually resolved by drinking water and taking NSAIDS.   Feels agitated and anxious lately. Has been more frustrated with her living/financial situation. May be more irritable and snappy lately. She does not like taking medication and prefers non-pharmacologic alternatives. She quilts for pleasure. She is trying to increase exercise by making it fun. Is using a fit bit which she finds "eye opening." Sleep is erratic. She has celexa at home that she doesn't really take regularly. "It is more of a decoration." She has a boyfriend who lives in [place]. This is a good relationship for her, he is very stable influence and she hopes to move closer to him when her daughter goes to college in a couple of years. She has a long history of bipolar disorder and feels that she is very in tune to her moods and has strategies to deal with her depression. She is not interested in being on any regular medication at this time other than her Klonopin which she takes sparingly.   She has noticed some vaginal itching, no significant discharge, no odor. Has some dryness with intercourse, uses OTC lubricant with satisfactory results.  Has  constant nasal drainage. Has occasionally used flonase and is not sure if it works. Did not use very regularly or for very long. Does not like to take antihisamines.   Review of Systems No chest pain, no SOB, no suicidal or homicidal ideations. Clear nasal drainage,     Objective:   Physical Exam  Constitutional: She is oriented to person, place, and time. She appears well-developed and well-nourished.  HENT:  Head: Normocephalic and atraumatic.  Right Ear: External ear normal.  Left Ear: External ear normal.  Nose: Mucosal edema and rhinorrhea present.  Post nasal drainage.   Eyes: Conjunctivae are normal. Pupils are equal, round, and reactive to light.  Neck: Normal range of motion. Neck supple.  Cardiovascular: Normal rate, regular rhythm, normal heart sounds and intact distal pulses.   Pulmonary/Chest: Effort normal and breath sounds normal. Right breast exhibits no inverted nipple, no mass, no nipple discharge, no skin change and no tenderness. Left breast exhibits no inverted nipple, no mass, no nipple discharge, no skin change and no tenderness. Breasts are symmetrical.  Abdominal: Soft. Bowel sounds are normal.  Genitourinary: No breast swelling, tenderness, discharge or bleeding. Pelvic exam was performed with patient supine. No labial fusion. There is no rash, tenderness, lesion or injury on the right labia. There is no rash, tenderness, lesion or injury on the left labia. Cervix exhibits no discharge and no friability. Vaginal discharge (small amount thin, white, nonodorus ) found.  IUD string visualized.   Musculoskeletal: Normal range of motion. She exhibits edema (+1 pretibial edema, hands slightly puffy.).  Neurological: She is alert and oriented to person, place, and time. She has normal reflexes.  Skin: Skin is warm and dry.  Psychiatric: Her behavior is normal. Judgment and thought content normal.  Flat affect.    Vitals reviewed. BP 120/80 mmHg  Pulse 86  Temp(Src)  98.8 F (37.1 C)  Resp 18  Wt 238 lb (107.956 kg)  SpO2 96%      POCT urinalysis dipstick  Result Value Ref Range   Color, UA yellow    Clarity, UA clear    Glucose, UA neg    Bilirubin, UA neg    Ketones, UA neg    Spec Grav, UA 1.015    Blood, UA trace    pH, UA 7.0    Protein, UA 30    Urobilinogen, UA 0.2    Nitrite, UA neg    Leukocytes, UA Trace   POCT Wet Prep with KOH  Result Value Ref Range   Trichomonas, UA Negative    Clue Cells Wet Prep HPF POC 5-9    Epithelial Wet Prep HPF POC 5-9    Yeast Wet Prep HPF POC neg    Bacteria Wet Prep HPF POC 1+    RBC Wet Prep HPF POC 0-3    WBC Wet Prep HPF POC 5-7    KOH Prep POC Negative     Assessment & Plan:  1. Annual physical exam - Thyroid Panel With TSH - Lipid panel - COMPLETE METABOLIC PANEL WITH GFR - HIV antibody - RPR - Pap IG, CT/NG w/ reflex HPV when ASC-U - POCT urinalysis dipstick - POCT Wet Prep with KOH  2. Allergic rhinitis, unspecified allergic rhinitis type - budesonide (RHINOCORT AQUA) 32 MCG/ACT nasal spray; Place 2 sprays into both nostrils daily.  Dispense: 8.6 g; Refill: 5  3. Edema - hydrochlorothiazide (MICROZIDE) 12.5 MG capsule; Take 1 capsule (12.5 mg total) by mouth daily.  Dispense: 30 capsule; Refill: 2 - encouraged her to continue to avoid processed and fast foods, to limit salt use at the table and with cooking.  4. Body mass index (BMI) of 40.1-44.9 in adult - encouraged continued weight loss and increased activity, offered nutrition referral, suggested other resources  5. Depression -offered names/numbers of counselors, patient declined at this time, feels like she is doing ok and is not interested in being on daily medication -discussed importance of sleep and good sleep hygiene, exercise, encouraged meditation, yoga  6. Vaginal itching -wet prep not strongly suggestive of BV, so will hold off on treatment given patient's adverse reaction the last time she took  metronidazole.  -Encouraged her to wear loose fitting clothing, and cotton underwear.  Follow up in 1 month, sooner if worsening symptoms.   Elby Beck, FNP-BC  [facility]  04/15/2014 10:22 PM

## 2014-04-13 NOTE — Patient Instructions (Signed)

## 2014-04-14 LAB — RPR

## 2014-04-14 LAB — HIV ANTIBODY (ROUTINE TESTING W REFLEX): HIV 1&2 Ab, 4th Generation: NONREACTIVE

## 2014-04-15 LAB — PAP IG, CT-NG, RFX HPV ASCU
Chlamydia Probe Amp: NEGATIVE
GC Probe Amp: NEGATIVE

## 2014-04-19 ENCOUNTER — Telehealth: Payer: Self-pay | Admitting: Radiology

## 2014-04-19 NOTE — Telephone Encounter (Signed)
Pt is anxious about her thyroid results. She doesn't understand why she needs to wait 3 more months to come in. She wants you to call her. Thanks!

## 2014-04-21 NOTE — Telephone Encounter (Signed)
Spoke with patient. She is anxious. She would like to come in sooner to get her TSH recheked. I would just recheck TSH in 1 month.

## 2014-06-01 ENCOUNTER — Ambulatory Visit (INDEPENDENT_AMBULATORY_CARE_PROVIDER_SITE_OTHER): Payer: BC Managed Care – PPO | Admitting: Family Medicine

## 2014-06-01 ENCOUNTER — Encounter: Payer: Self-pay | Admitting: Family Medicine

## 2014-06-01 VITALS — BP 138/82 | HR 78 | Temp 98.3°F | Resp 16 | Ht 65.0 in | Wt 243.0 lb

## 2014-06-01 DIAGNOSIS — F329 Major depressive disorder, single episode, unspecified: Secondary | ICD-10-CM

## 2014-06-01 DIAGNOSIS — R7989 Other specified abnormal findings of blood chemistry: Secondary | ICD-10-CM

## 2014-06-01 DIAGNOSIS — F32A Depression, unspecified: Secondary | ICD-10-CM

## 2014-06-01 DIAGNOSIS — Z6841 Body Mass Index (BMI) 40.0 and over, adult: Secondary | ICD-10-CM

## 2014-06-01 DIAGNOSIS — E049 Nontoxic goiter, unspecified: Secondary | ICD-10-CM

## 2014-06-01 LAB — C-REACTIVE PROTEIN: CRP: 2.2 mg/dL — ABNORMAL HIGH (ref <0.60)

## 2014-06-01 LAB — SEDIMENTATION RATE: Sed Rate: 18 mm/hr (ref 0–22)

## 2014-06-01 MED ORDER — CITALOPRAM HYDROBROMIDE 40 MG PO TABS: 40.0000 mg | ORAL_TABLET | Freq: Every day | ORAL | Status: AC

## 2014-06-01 NOTE — Progress Notes (Signed)
   Subjective:    Patient ID: Teresa Herrera, female    DOB: [birth year], 38 y.o.   MRN: [id number]  HPI Patient presents today for follow up of anxiety. She is concerned about her low TSH. Her grandmother had thyroid problems in the past.  Has had more episodes of anxiety since last visit. Feels more irritable and short tempered.  Is feeling more fatigued and lethargic some days, has other days with good energy. Large swings in sleeping hours. Has used Klonopin 3x in last 6 weeks. Helped with sleep.  Having headaches regularly, occasionally bad. Has taken imitrex in the past with good results. Has photophobia and phonophobia. Resolves with sleep/dark/quiet. Does breathing exercises from [place].   Has had some hot flashes lately. Has 3-4 soft stools a day. Also seems related to increased anxiety. Spends a lot of time trying to have BM or waiting for BM. Has some pain right flank. Colonoscopy with Dr. Collene Mares- normal bx. But told her she had some UC and to follow up in 5 years. Had given her samples of medicine that did not seem to help.   Pain in right side of neck for 1 week. Feels warm to patient.   Review of Systems  Constitutional: Positive for chills and fatigue. Negative for fever.  HENT: Positive for congestion. Dental problem: better.   Respiratory: Negative for cough. Shortness of breath: occasional with agitiation.   Cardiovascular: Negative for chest pain and leg swelling.  Endocrine: Positive for heat intolerance.  Psychiatric/Behavioral: Positive for decreased concentration and agitation. The patient is nervous/anxious.       Objective:   Physical Exam  Constitutional: She is oriented to person, place, and time. She appears well-developed and well-nourished.  HENT:  Head: Normocephalic and atraumatic.  Right Ear: Tympanic membrane, external ear and ear canal normal.  Left Ear: Tympanic membrane, external ear and ear canal normal.  Nose: Nose normal.    Mouth/Throat: Uvula is midline and mucous membranes are normal. Posterior oropharyngeal erythema present.  Eyes: Conjunctivae are normal. Pupils are equal, round, and reactive to light.  Neck: Normal range of motion. Neck supple.  Fullness and tenderness right anterior neck.  Cardiovascular: Normal rate, regular rhythm and normal heart sounds.   Pulmonary/Chest: Effort normal and breath sounds normal.  Musculoskeletal: Normal range of motion.  Neurological: She is alert and oriented to person, place, and time. She has normal reflexes.  Skin: Skin is warm and dry.  Psychiatric: She has a normal mood and affect. Her behavior is normal. Judgment and thought content normal.  Vitals reviewed. BP 138/82 mmHg  Pulse 78  Temp(Src) 98.3 F (36.8 C)  Resp 16  Ht 5\' 5"  (1.651 m)  Wt 243 lb (110.224 kg)  BMI 40.44 kg/m2  SpO2 98%     Assessment & Plan:  1. Body mass index (BMI) of 40.1-44.9 in adult - Thyroid Panel With TSH  2. Depression - Thyroid Panel With TSH - C-reactive protein - Sedimentation rate - US Soft Tissue Head/Neck; Future - Encouraged patient to take celexa daily, use klonopin for sleep  3. Low serum thyroid stimulating hormone (TSH) - Thyroid Panel With TSH - C-reactive protein - Sedimentation rate - US Soft Tissue Head/Neck; Future  4. Enlarged thyroid - Thyroid Panel With TSH - C-reactive protein - Sedimentation rate - US Soft Tissue Head/Neck; Future  Elby Beck, FNP-BC  [facility]  06/02/2014 5:09 PM

## 2014-06-02 LAB — THYROID PANEL WITH TSH
Free Thyroxine Index: 2.2 (ref 1.4–3.8)
T3 Uptake: 29 % (ref 22–35)
T4, Total: 7.5 ug/dL (ref 4.5–12.0)
TSH: 0.793 u[IU]/mL (ref 0.350–4.500)

## 2014-06-03 ENCOUNTER — Ambulatory Visit
Admission: RE | Admit: 2014-06-03 | Discharge: 2014-06-03 | Disposition: A | Discharge status: Home / Self Care | Payer: BC Managed Care – PPO | Source: Ambulatory Visit | Attending: Family Medicine | Admitting: Family Medicine

## 2014-06-03 DIAGNOSIS — F329 Major depressive disorder, single episode, unspecified: Secondary | ICD-10-CM

## 2014-06-03 DIAGNOSIS — R7989 Other specified abnormal findings of blood chemistry: Secondary | ICD-10-CM

## 2014-06-03 DIAGNOSIS — E049 Nontoxic goiter, unspecified: Secondary | ICD-10-CM

## 2014-06-03 DIAGNOSIS — F32A Depression, unspecified: Secondary | ICD-10-CM

## 2014-06-03 NOTE — Progress Notes (Signed)
We also can consider doing thyroid antibodies rule out Hashimoto's thyroiditis

## 2014-11-11 ENCOUNTER — Ambulatory Visit (INDEPENDENT_AMBULATORY_CARE_PROVIDER_SITE_OTHER): Payer: BLUE CROSS/BLUE SHIELD

## 2014-11-11 ENCOUNTER — Ambulatory Visit (INDEPENDENT_AMBULATORY_CARE_PROVIDER_SITE_OTHER): Payer: BLUE CROSS/BLUE SHIELD | Admitting: Physician Assistant

## 2014-11-11 VITALS — BP 128/76 | HR 80 | Temp 98.9°F | Resp 17 | Ht 65.0 in | Wt 246.8 lb

## 2014-11-11 DIAGNOSIS — R51 Headache: Secondary | ICD-10-CM

## 2014-11-11 DIAGNOSIS — M542 Cervicalgia: Secondary | ICD-10-CM

## 2014-11-11 DIAGNOSIS — R519 Headache, unspecified: Secondary | ICD-10-CM

## 2014-11-11 LAB — POCT CBC
Granulocyte percent: 48.7 %G (ref 37–80)
HCT, POC: 43.7 % (ref 37.7–47.9)
Hemoglobin: 13.8 g/dL (ref 12.2–16.2)
Lymph, poc: 3.9 — AB (ref 0.6–3.4)
MCH, POC: 29 pg (ref 27–31.2)
MCHC: 31.5 g/dL — AB (ref 31.8–35.4)
MCV: 91.8 fL (ref 80–97)
MID (cbc): 0.6 (ref 0–0.9)
MPV: 8.7 fL (ref 0–99.8)
POC Granulocyte: 4.2 (ref 2–6.9)
POC LYMPH PERCENT: 44.8 %L (ref 10–50)
POC MID %: 6.5 %M (ref 0–12)
Platelet Count, POC: 299 10*3/uL (ref 142–424)
RBC: 4.76 M/uL (ref 4.04–5.48)
RDW, POC: 15.3 %
WBC: 8.7 10*3/uL (ref 4.6–10.2)

## 2014-11-11 LAB — TSH: TSH: 2.293 u[IU]/mL (ref 0.350–4.500)

## 2014-11-11 MED ORDER — CYCLOBENZAPRINE HCL 10 MG PO TABS: 10.0000 mg | ORAL_TABLET | Freq: Three times a day (TID) | ORAL | Status: DC | PRN

## 2014-11-11 NOTE — Patient Instructions (Addendum)
If you develop in fever, nausea, numbness/tingling in extremity, chest pain, dizziness, vision changes--please return to [place] immediately. Take the medication as prescribed.    Cervical Sprain A cervical sprain is an injury in the neck in which the strong, fibrous tissues (ligaments) that connect your neck bones stretch or tear. Cervical sprains can range from mild to severe. Severe cervical sprains can cause the neck vertebrae to be unstable. This can lead to damage of the spinal cord and can result in serious nervous system problems. The amount of time it takes for a cervical sprain to get better depends on the cause and extent of the injury. Most cervical sprains heal in 1 to 3 weeks. CAUSES  Severe cervical sprains may be caused by:   Contact sport injuries (such as from football, rugby, wrestling, hockey, auto racing, gymnastics, diving, martial arts, or boxing).   Motor vehicle collisions.   Whiplash injuries. This is an injury from a sudden forward and backward whipping movement of the head and neck.  Falls.  Mild cervical sprains may be caused by:   Being in an awkward position, such as while cradling a telephone between your ear and shoulder.   Sitting in a chair that does not offer proper support.   Working at a poorly Landscape architect station.   Looking up or down for long periods of time.  SYMPTOMS   Pain, soreness, stiffness, or a burning sensation in the front, back, or sides of the neck. This discomfort may develop immediately after the injury or slowly, 24 hours or more after the injury.   Pain or tenderness directly in the middle of the back of the neck.   Shoulder or upper back pain.   Limited ability to move the neck.   Headache.   Dizziness.   Weakness, numbness, or tingling in the hands or arms.   Muscle spasms.   Difficulty swallowing or chewing.   Tenderness and swelling of the neck.  DIAGNOSIS  Most of the time your health care  provider can diagnose a cervical sprain by taking your history and doing a physical exam. Your health care provider will ask about previous neck injuries and any known neck problems, such as arthritis in the neck. X-rays may be taken to find out if there are any other problems, such as with the bones of the neck. Other tests, such as a CT scan or MRI, may also be needed.  TREATMENT  Treatment depends on the severity of the cervical sprain. Mild sprains can be treated with rest, keeping the neck in place (immobilization), and pain medicines. Severe cervical sprains are immediately immobilized. Further treatment is done to help with pain, muscle spasms, and other symptoms and may include:  Medicines, such as pain relievers, numbing medicines, or muscle relaxants.   Physical therapy. This may involve stretching exercises, strengthening exercises, and posture training. Exercises and improved posture can help stabilize the neck, strengthen muscles, and help stop symptoms from returning.  HOME CARE INSTRUCTIONS   Put ice on the injured area.   Put ice in a plastic bag.   Place a towel between your skin and the bag.   Leave the ice on for 15-20 minutes, 3-4 times a day.   If your injury was severe, you may have been given a cervical collar to wear. A cervical collar is a two-piece collar designed to keep your neck from moving while it heals.  Do not remove the collar unless instructed by your health care provider.  If you have long hair, keep it outside of the collar.  Ask your health care provider before making any adjustments to your collar. Minor adjustments may be required over time to improve comfort and reduce pressure on your chin or on the back of your head.  Ifyou are allowed to remove the collar for cleaning or bathing, follow your health care provider's instructions on how to do so safely.  Keep your collar clean by wiping it with mild soap and water and drying it completely. If  the collar you have been given includes removable pads, remove them every 1-2 days and hand wash them with soap and water. Allow them to air dry. They should be completely dry before you wear them in the collar.  If you are allowed to remove the collar for cleaning and bathing, wash and dry the skin of your neck. Check your skin for irritation or sores. If you see any, tell your health care provider.  Do not drive while wearing the collar.   Only take over-the-counter or prescription medicines for pain, discomfort, or fever as directed by your health care provider.   Keep all follow-up appointments as directed by your health care provider.   Keep all physical therapy appointments as directed by your health care provider.   Make any needed adjustments to your workstation to promote good posture.   Avoid positions and activities that make your symptoms worse.   Warm up and stretch before being active to help prevent problems.  SEEK MEDICAL CARE IF:   Your pain is not controlled with medicine.   You are unable to decrease your pain medicine over time as planned.   Your activity level is not improving as expected.  SEEK IMMEDIATE MEDICAL CARE IF:   You develop any bleeding.  You develop stomach upset.  You have signs of an allergic reaction to your medicine.   Your symptoms get worse.   You develop new, unexplained symptoms.   You have numbness, tingling, weakness, or paralysis in any part of your body.  MAKE SURE YOU:   Understand these instructions.  Will watch your condition.  Will get help right away if you are not doing well or get worse. Document Released: 03/18/2007 Document Revised: 05/26/2013 Document Reviewed: 11/26/2012 [facility] Patient Information 2015 [place], [place]. This information is not intended to replace advice given to you by your health care provider. Make sure you discuss any questions you have with your health care provider.

## 2014-11-11 NOTE — Progress Notes (Signed)
[facility] [address] [place] [address] [phone]  Date:  11/11/2014   Name:  Teresa Herrera   DOB:  [birth year]   MRN:  [id number]  PCP:  Elyn Peers, MD    History of Present Illness:  Teresa Herrera is a 39 y.o. female patient who presents to [facility] chief complaint of neck pain for the last 4 days.  Pain is at the right side of her neck and radiates up towards her jaw , teeth , ear , and down her chest. It hurts to speak and is painful to swallow. She feels like it's warm to the touch , but she has not noticed any swelling. She doesn't think that she slept on it wrong. The pain has progressively worsened , and she believes she woke up with this pain. She had this pain in a similar  In incident , but notes that this is far worse. Similar incident was 6 months ago when she was seen here at [facility] after having similar neck pain but it was on both sides of her neck. This was seen by NP Carlean Purl where she had a slightly decreased  TSH , but normal ultrasound. The TSH was later checked to find a normal read and  In no further treatment was needed. She does not any fever from this there is no upper respiratory symptoms of cough sore throat or nasal congestion. She has no shortness of breath. She feels as if she is salivating more but denies drooling.     Patient Active Problem List   Diagnosis Date Noted  . Depression 03/23/2011  . Menometrorrhagia 03/23/2011    Past Medical History  Diagnosis Date  . Bipolar 1 disorder   . Menometrorrhagia 03/23/2011  . Depression   . Depression 03/23/2011  . Hypertension   . Anxiety   . Obesity     Past Surgical History  Procedure Laterality Date  . Wisdom tooth extraction      History  Substance Use Topics  . Smoking status: Never Smoker   . Smokeless tobacco: Never Used  . Alcohol Use: 0.0 oz/week     Comment: drink socially    Family History  Problem Relation Age of Onset  . Hypertension Mother   .  Heart disease Maternal Grandmother   . Hyperlipidemia Maternal Grandmother   . Hypertension Maternal Grandmother     Allergies  Allergen Reactions  . Cauliflower Hives  . Demerol     Hallucinations   . Sulfa Drugs Cross Reactors Hives    Medication list has been reviewed and updated.  Current Outpatient Prescriptions on File Prior to Visit  Medication Sig Dispense Refill  . budesonide (RHINOCORT AQUA) 32 MCG/ACT nasal spray Place 2 sprays into both nostrils daily. 8.6 g 5  . citalopram (CELEXA) 40 MG tablet Take 1 tablet (40 mg total) by mouth daily. 90 tablet 1  . clonazePAM (KLONOPIN) 0.5 MG tablet Take 0.5 mg by mouth daily as needed for anxiety.    . hydrochlorothiazide (MICROZIDE) 12.5 MG capsule Take 1 capsule (12.5 mg total) by mouth daily. 30 capsule 2  . levonorgestrel (MIRENA) 20 MCG/24HR IUD 1 each by Intrauterine route once.    Marland Kitchen oxyCODONE-acetaminophen (PERCOCET/ROXICET) 5-325 MG per tablet Take 1-2 tablets by mouth every 4 (four) hours as needed for moderate pain or severe pain.    . ranitidine (ZANTAC) 150 MG tablet Take 150 mg by mouth 2 (two) times daily as needed for heartburn.  No current facility-administered medications on file prior to visit.    ROS ROS otherwise unremarkable unless listed above.  Physical Examination: BP 128/76 mmHg  Pulse 80  Temp(Src) 98.9 F (37.2 C) (Oral)  Resp 17  Ht 5\' 5"  (1.651 m)  Wt 246 lb 12.8 oz (111.948 kg)  BMI 41.07 kg/m2  SpO2 98% Ideal Body Weight: Weight in (lb) to have BMI = 25: 149.9  Physical Exam  alert, cooperative, oriented 4. PERRLA with normal conjunctiva and no photophobia.  External ear, ear canal, or tympanic membrane normal without erythema, bulging, or effusion.  There is pain with pulling the ear to view the ear canal.  Mastoids without visualized swelling or erythema, although she is tender around the right ear with palpation.  Maxillary tenderness at right side with palpation though there is no  swelling.  Nasal mucosa without  Edema or rhinorrhea. Oral mucosa normal without tonsillar erythema , edema , or exudate.   Dentition normal without any erythema to gums or swelling thyroid is full and tender on right side however the pain is more prominent above the thyroid and near the anterior cervical lymph nodes. There is no lymphadenopathy  In this area as well as notch in the reticular tonsillar , or subclavicular area. Heart rate of regular rate and rhythm without murmurs, allops, or rubs. Lungs sounds are normal without wheezing or rhonchi. She has a normal radial pulse. Carotid normal without bruit. She has  Normal cervical range of motion without spinous tenderness. Horizontal rotation to the left sparks the right sided pain.  Sinus [facility] reading (PRIMARY) by  Dr. Lorelei Pont: Negative Cervical [facility] reading (PRIMARY) by  Dr. Lorelei Pont: Cervical lordosis consistent with muscle spasm  Results for orders placed or performed in visit on 11/11/14  POCT CBC  Result Value Ref Range   WBC 8.7 4.6 - 10.2 K/uL   Lymph, poc 3.9 (A) 0.6 - 3.4   POC LYMPH PERCENT 44.8 10 - 50 %L   MID (cbc) 0.6 0 - 0.9   POC MID % 6.5 0 - 12 %M   POC Granulocyte 4.2 2 - 6.9   Granulocyte percent 48.7 37 - 80 %G   RBC 4.76 4.04 - 5.48 M/uL   Hemoglobin 13.8 12.2 - 16.2 g/dL   HCT, POC 43.7 37.7 - 47.9 %   MCV 91.8 80 - 97 fL   MCH, POC 29.0 27 - 31.2 pg   MCHC 31.5 (A) 31.8 - 35.4 g/dL   RDW, POC 15.3 %   Platelet Count, POC 299 142 - 424 K/uL   MPV 8.7 0 - 99.8 fL    Assessment and Plan: 39 year old female is here with right sided neck pain for 4 days.  Diff dx includes muscle spasms, abscess, aneurysm, thyroiditis.  CBC is insignificant.  I will recheck TSH.  I recommended a CT at this time.   I explained the different more life threatening illnesses that may wait without imaging.  She declines at this time, and would prefer to see whether her symptoms will be relieved by muscle relaxants.  I have advised her of  alarming symptoms that warrant an immediate rtc, and to return if symptoms worsen, where a CT will be ordered.  Advised ice, NSAIDs, and neck stretches, but stabilization in a neck collar through the day may help with support.  She agrees to plan and will return if there is no relief within 48 hours of treatment plan.    Facial pain -  Plan: POCT CBC, TSH, DG SinUS 1-2 Views, CANCELED: DG Sinuses Complete  Neck pain - Plan: POCT CBC, TSH, DG Cervical Spine 2 or 3 views, cyclobenzaprine (FLEXERIL) 10 MG tablet  Ivar Drape, PA-C [facility] 6/9/20169:01 PM

## 2016-03-31 ENCOUNTER — Ambulatory Visit (INDEPENDENT_AMBULATORY_CARE_PROVIDER_SITE_OTHER): Payer: BLUE CROSS/BLUE SHIELD | Admitting: Family Medicine

## 2016-03-31 VITALS — BP 132/88 | HR 78 | Temp 98.2°F | Resp 16 | Ht 65.0 in | Wt 250.0 lb

## 2016-03-31 DIAGNOSIS — R350 Frequency of micturition: Secondary | ICD-10-CM

## 2016-03-31 DIAGNOSIS — M542 Cervicalgia: Secondary | ICD-10-CM | POA: Diagnosis not present

## 2016-03-31 DIAGNOSIS — Z23 Encounter for immunization: Secondary | ICD-10-CM | POA: Diagnosis not present

## 2016-03-31 DIAGNOSIS — N760 Acute vaginitis: Secondary | ICD-10-CM

## 2016-03-31 LAB — POCT WET + KOH PREP
Trich by wet prep: ABSENT
Yeast by KOH: ABSENT
Yeast by wet prep: ABSENT

## 2016-03-31 LAB — POCT URINALYSIS DIP (MANUAL ENTRY)
Bilirubin, UA: NEGATIVE
Blood, UA: NEGATIVE
Glucose, UA: NEGATIVE
Ketones, POC UA: NEGATIVE
Leukocytes, UA: NEGATIVE
Nitrite, UA: NEGATIVE
Protein Ur, POC: NEGATIVE
Spec Grav, UA: 1.015
Urobilinogen, UA: 0.2
pH, UA: 7

## 2016-03-31 LAB — POC MICROSCOPIC URINALYSIS (UMFC): Mucus: ABSENT

## 2016-03-31 MED ORDER — CYCLOBENZAPRINE HCL 10 MG PO TABS: 10.0000 mg | ORAL_TABLET | Freq: Three times a day (TID) | ORAL | 0 refills | Status: AC | PRN

## 2016-03-31 MED ORDER — METRONIDAZOLE 500 MG PO TABS: 500.0000 mg | ORAL_TABLET | Freq: Three times a day (TID) | ORAL | 0 refills | Status: DC

## 2016-03-31 MED ORDER — METRONIDAZOLE 500 MG PO TABS: 500.0000 mg | ORAL_TABLET | Freq: Two times a day (BID) | ORAL | 0 refills | Status: AC

## 2016-03-31 NOTE — Progress Notes (Signed)
Chief Complaint  Patient presents with  . Urinary Frequency    x 1 wk  . Dysuria    x 1wk    HPI   Interested in getting checked for UTI She reports burning in her vagina at the urethra She states that she had noticed some discharge but not a lot. The discharge is neither watery nor thick and there is no odor. She has a mirena IUD that has been in place for more than 5 years but not sure She does not recall her last pap She denies recent sexual intercourse (last intercourse May 2017)  At the close of the visit she mentioned that  She also as a history of shoulder muscle spasms She states that this pain has been intermittent for almost a year and she is having another flare. States that takes flexeril which helps her right shoulder muscles  Past Medical History:  Diagnosis Date  . Anxiety   . Bipolar 1 disorder ([place])   . Depression   . Depression 03/23/2011  . Hypertension   . Menometrorrhagia 03/23/2011  . Obesity     Current Outpatient Prescriptions  Medication Sig Dispense Refill  . budesonide (RHINOCORT AQUA) 32 MCG/ACT nasal spray Place 2 sprays into both nostrils daily. 8.6 g 5  . citalopram (CELEXA) 40 MG tablet Take 1 tablet (40 mg total) by mouth daily. 90 tablet 1  . clonazePAM (KLONOPIN) 0.5 MG tablet Take 0.5 mg by mouth daily as needed for anxiety.    Marland Kitchen levonorgestrel (MIRENA) 20 MCG/24HR IUD 1 each by Intrauterine route once.    Marland Kitchen oxyCODONE-acetaminophen (PERCOCET/ROXICET) 5-325 MG per tablet Take 1-2 tablets by mouth every 4 (four) hours as needed for moderate pain or severe pain.    . ranitidine (ZANTAC) 150 MG tablet Take 150 mg by mouth 2 (two) times daily as needed for heartburn.    . cyclobenzaprine (FLEXERIL) 10 MG tablet Take 1 tablet (10 mg total) by mouth 3 (three) times daily as needed for muscle spasms. (Patient not taking: Reported on 03/31/2016) 30 tablet 0  . hydrochlorothiazide (MICROZIDE) 12.5 MG capsule Take 1 capsule (12.5 mg total) by mouth  daily. (Patient not taking: Reported on 03/31/2016) 30 capsule 2  . metroNIDAZOLE (FLAGYL) 500 MG tablet Take 1 tablet (500 mg total) by mouth 3 (three) times daily. 21 tablet 0   No current facility-administered medications for this visit.     Allergies:  Allergies  Allergen Reactions  . Cauliflower Hives  . Demerol     Hallucinations   . Sulfa Drugs Cross Reactors Hives    Past Surgical History:  Procedure Laterality Date  . WISDOM TOOTH EXTRACTION      Social History   Social History  . Marital status: Single    Spouse name: N/A  . Number of children: N/A  . Years of education: N/A   Social History Main Topics  . Smoking status: Never Smoker  . Smokeless tobacco: Never Used  . Alcohol use 0.0 oz/week     Comment: drink socially  . Drug use: No  . Sexual activity: Not Currently    Partners: Male    Birth control/ protection: Inserts     Comment: Nuvo Ring   Other Topics Concern  . None   Social History Narrative  . None    ROS  Objective: Vitals:   03/31/16 0816  BP: 132/88  Pulse: 78  Resp: 16  Temp: 98.2 F (36.8 C)  TempSrc: Oral  SpO2: 100%  Weight: 250 lb (113.4 kg)  Height: 5\' 5"  (1.651 m)    Physical Exam  Gen: alert and oriented in NAD Resp: normal effort GU No labial irritation, watery green vaginal discharge, IUD string not visualized No CMT    Assessment and Plan Teresa Herrera was seen today for urinary frequency and dysuria.  Diagnoses and all orders for this visit:  Urinary frequency- no UTI -     POCT Microscopic Urinalysis ([facility]) -     POCT urinalysis dipstick  Vaginitis and vulvovaginitis- Clue cells Started pt on Flagyl Advised to also take probiotic -     POCT Wet + KOH Prep -     GC/Chlamydia Probe Amp -     metroNIDAZOLE (FLAGYL) 500 MG tablet; Take 1 tablet (500 mg total) by mouth 3 (three) times daily.  Need for prophylactic vaccination and inoculation against influenza -     Flu Vaccine QUAD 36+ mos  IM   Neck Pain Advised to return for a more detailed musculoskeletal exam for shoulder pain 14 tabs of flexeril refilled     [place]

## 2016-03-31 NOTE — Patient Instructions (Addendum)
Please return for a visit to get your pap smear and set up a mirena consultation   IF you received an x-ray today, you will receive an invoice from [facility]. Please contact [facility] at [phone] with questions or concerns regarding your invoice.   IF you received labwork today, you will receive an invoice from [employer]. Please contact [facility] at [phone] with questions or concerns regarding your invoice.   Our billing staff will not be able to assist you with questions regarding bills from these companies.  You will be contacted with the lab results as soon as they are available. The fastest way to get your results is to activate your My Chart account. Instructions are located on the last page of this paperwork. If you have not heard from [place] regarding the results in 2 weeks, please contact this office.      Vaginitis Vaginitis is an inflammation of the vagina. It is most often caused by a change in the normal balance of the bacteria and yeast that live in the vagina. This change in balance causes an overgrowth of certain bacteria or yeast, which causes the inflammation. There are different types of vaginitis, but the most common types are:  Bacterial vaginosis.  Yeast infection (candidiasis).  Trichomoniasis vaginitis. This is a sexually transmitted infection (STI).  Viral vaginitis.  Atrophic vaginitis.  Allergic vaginitis. CAUSES  The cause depends on the type of vaginitis. Vaginitis can be caused by:  Bacteria (bacterial vaginosis).  Yeast (yeast infection).  A parasite (trichomoniasis vaginitis)  A virus (viral vaginitis).  Low hormone levels (atrophic vaginitis). Low hormone levels can occur during pregnancy, breastfeeding, or after menopause.  Irritants, such as bubble baths, scented tampons, and feminine sprays (allergic vaginitis). Other factors can change the normal balance of the yeast and bacteria that  live in the vagina. These include:  Antibiotic medicines.  Poor hygiene.  Diaphragms, vaginal sponges, spermicides, birth control pills, and intrauterine devices (IUD).  Sexual intercourse.  Infection.  Uncontrolled diabetes.  A weakened immune system. SYMPTOMS  Symptoms can vary depending on the cause of the vaginitis. Common symptoms include:  Abnormal vaginal discharge.  The discharge is white, gray, or yellow with bacterial vaginosis.  The discharge is thick, white, and cheesy with a yeast infection.  The discharge is frothy and yellow or greenish with trichomoniasis.  A bad vaginal odor.  The odor is fishy with bacterial vaginosis.  Vaginal itching, pain, or swelling.  Painful intercourse.  Pain or burning when urinating. Sometimes, there are no symptoms. TREATMENT  Treatment will vary depending on the type of infection.   Bacterial vaginosis and trichomoniasis are often treated with antibiotic creams or pills.  Yeast infections are often treated with antifungal medicines, such as vaginal creams or suppositories.  Viral vaginitis has no cure, but symptoms can be treated with medicines that relieve discomfort. Your sexual partner should be treated as well.  Atrophic vaginitis may be treated with an estrogen cream, pill, suppository, or vaginal ring. If vaginal dryness occurs, lubricants and moisturizing creams may help. You may be told to avoid scented soaps, sprays, or douches.  Allergic vaginitis treatment involves quitting the use of the product that is causing the problem. Vaginal creams can be used to treat the symptoms. HOME CARE INSTRUCTIONS   Take all medicines as directed by your caregiver.  Keep your genital area clean and dry. Avoid soap and only rinse the area with water.  Avoid douching. It can remove the  healthy bacteria in the vagina.  Do not use tampons or have sexual intercourse until your vaginitis has been treated. Use sanitary pads while  you have vaginitis.  Wipe from front to back. This avoids the spread of bacteria from the rectum to the vagina.  Let air reach your genital area.  Wear cotton underwear to decrease moisture buildup.  Avoid wearing underwear while you sleep until your vaginitis is gone.  Avoid tight pants and underwear or nylons without a cotton panel.  Take off wet clothing (especially bathing suits) as soon as possible.  Use mild, non-scented products. Avoid using irritants, such as:  Scented feminine sprays.  Fabric softeners.  Scented detergents.  Scented tampons.  Scented soaps or bubble baths.  Practice safe sex and use condoms. Condoms may prevent the spread of trichomoniasis and viral vaginitis. SEEK MEDICAL CARE IF:   You have abdominal pain.  You have a fever or persistent symptoms for more than 2-3 days.  You have a fever and your symptoms suddenly get worse.   This information is not intended to replace advice given to you by your health care provider. Make sure you discuss any questions you have with your health care provider.   Document Released: 03/18/2007 Document Revised: 10/05/2014 Document Reviewed: 11/01/2011 Elsevier Interactive Patient Education [employer].

## 2016-04-03 LAB — GC/CHLAMYDIA PROBE AMP
CT Probe RNA: NOT DETECTED
GC Probe RNA: NOT DETECTED

## 2016-04-04 ENCOUNTER — Encounter: Payer: Self-pay | Admitting: Family Medicine

## 2016-05-03 ENCOUNTER — Telehealth: Payer: Self-pay

## 2016-05-03 NOTE — Telephone Encounter (Signed)
Patient states that she was discussing changing her Odum with Dr. Nolon Rod. Patient would like to get that done by the end of the year and stated that Dr. Gretchen Short would like notice before scheduling an appointment. Please advise!   [phone]

## 2016-05-04 NOTE — Telephone Encounter (Signed)
Are you able to do this and when?

## 2016-05-06 NOTE — Telephone Encounter (Signed)
I will be able to do this in the office.  Can we ensure that the practice has the following: - one tenaculum One pair of long curved scissors One sponge forceps And one plastic uterine sound  If we do not have these maybe we can borrow them from Gynecology or order them.  Once I have verified that we have the equipment then I can place it here in the office.

## 2016-05-08 NOTE — Telephone Encounter (Signed)
We are in the process of discussing this process and will advise pt. When able

## 2016-05-14 NOTE — Telephone Encounter (Signed)
Please advise this patient to return to clinic to complete the order form and insurance paperwork for her mirena.  Let her know that this step is required to get it properly authorized. This appointment will be 15-20 minutes.  When the appointment is made please put mirena consultation in the note screen so I can get to her as soon as she checks in.

## 2016-05-18 NOTE — Telephone Encounter (Signed)
Patient will come in today or tomorrow to fill out the paperwork for the Mirena.

## 2016-05-19 ENCOUNTER — Ambulatory Visit (INDEPENDENT_AMBULATORY_CARE_PROVIDER_SITE_OTHER): Payer: BLUE CROSS/BLUE SHIELD | Admitting: Family Medicine

## 2016-05-19 VITALS — BP 136/84 | HR 90 | Temp 98.6°F | Resp 16 | Ht 66.0 in | Wt 244.0 lb

## 2016-05-19 DIAGNOSIS — H9201 Otalgia, right ear: Secondary | ICD-10-CM | POA: Diagnosis not present

## 2016-05-19 DIAGNOSIS — Z30431 Encounter for routine checking of intrauterine contraceptive device: Secondary | ICD-10-CM

## 2016-05-19 NOTE — Patient Instructions (Signed)
     IF you received an x-ray today, you will receive an invoice from [facility]. Please contact [facility] at [phone] with questions or concerns regarding your invoice.   IF you received labwork today, you will receive an invoice from [facility]. Please contact [facility] at [phone] with questions or concerns regarding your invoice.   Our billing staff will not be able to assist you with questions regarding bills from these companies.  You will be contacted with the lab results as soon as they are available. The fastest way to get your results is to activate your My Chart account. Instructions are located on the last page of this paperwork. If you have not heard from us regarding the results in 2 weeks, please contact this office.     

## 2016-05-19 NOTE — Progress Notes (Signed)
Chief Complaint  Patient presents with  . Other    Mirena consult  . Ear Pain    right, x 1 day    HPI  Pt here for mirena consultation- last mirena was 2012, no return of menses, no uncontrolled htn or migraines. Nonsmoker. No std history or vag discharge   right ear pain for 24 hours. Did not take her usual allergy meds. No fevers or chills. No dizziness or tinnitus  Past Medical History:  Diagnosis Date  . Anxiety   . Bipolar 1 disorder ([place])   . Depression   . Depression 03/23/2011  . Hypertension   . Menometrorrhagia 03/23/2011  . Obesity     Current Outpatient Prescriptions  Medication Sig Dispense Refill  . budesonide (RHINOCORT AQUA) 32 MCG/ACT nasal spray Place 2 sprays into both nostrils daily. 8.6 g 5  . citalopram (CELEXA) 40 MG tablet Take 1 tablet (40 mg total) by mouth daily. 90 tablet 1  . clonazePAM (KLONOPIN) 0.5 MG tablet Take 0.5 mg by mouth daily as needed for anxiety.    . cyclobenzaprine (FLEXERIL) 10 MG tablet Take 1 tablet (10 mg total) by mouth 3 (three) times daily as needed for muscle spasms. 14 tablet 0  . levonorgestrel (MIRENA) 20 MCG/24HR IUD 1 each by Intrauterine route once.    Marland Kitchen oxyCODONE-acetaminophen (PERCOCET/ROXICET) 5-325 MG per tablet Take 1-2 tablets by mouth every 4 (four) hours as needed for moderate pain or severe pain.    . ranitidine (ZANTAC) 150 MG tablet Take 150 mg by mouth 2 (two) times daily as needed for heartburn.    . hydrochlorothiazide (MICROZIDE) 12.5 MG capsule Take 1 capsule (12.5 mg total) by mouth daily. (Patient not taking: Reported on 05/19/2016) 30 capsule 2   No current facility-administered medications for this visit.     Allergies:  Allergies  Allergen Reactions  . Cauliflower Hives  . Demerol     Hallucinations   . Sulfa Drugs Cross Reactors Hives    Past Surgical History:  Procedure Laterality Date  . WISDOM TOOTH EXTRACTION      Social History   Social History  . Marital status: Single      Spouse name: N/A  . Number of children: N/A  . Years of education: N/A   Social History Main Topics  . Smoking status: Never Smoker  . Smokeless tobacco: Never Used  . Alcohol use 0.0 oz/week     Comment: drink socially  . Drug use: No  . Sexual activity: Not Currently    Partners: Male    Birth control/ protection: Inserts     Comment: Nuvo Ring   Other Topics Concern  . None   Social History Narrative  . None    ROS  Objective: Vitals:   05/19/16 1428  BP: 136/84  Pulse: 90  Resp: 16  Temp: 98.6 F (37 C)  SpO2: 99%  Weight: 244 lb (110.7 kg)  Height: 5\' 6"  (1.676 m)    Physical Exam General: alert, oriented, in NAD Head: normocephalic, atraumatic, no sinus tenderness Eyes: EOM intact, no scleral icterus or conjunctival injection Ears: TM clear bilaterally with clear fluid and bulging TM Throat: no pharyngeal exudate or erythema Lymph: no posterior auricular, submental or cervical lymph adenopathy Heart: normal rate, normal sinus rhythm, no murmurs Lungs: clear to auscultation bilaterally, no wheezing   Assessment and Plan Skylaar was seen today for other and ear pain.  Diagnoses and all orders for this visit:  Right ear pain- flonase  and antihistamine advised  Encounter for management of intrauterine contraceptive device (IUD), unspecified IUD management type - Completed and faxed IUD order today  Will perform pap, iud removal, and iud reinsertion next visit  [place]

## 2016-05-29 ENCOUNTER — Telehealth: Payer: Self-pay

## 2016-05-29 NOTE — Telephone Encounter (Signed)
Pt is checking to see if her IUD is here  Best number [phone] ok to leave a message

## 2016-05-30 ENCOUNTER — Telehealth: Payer: Self-pay

## 2016-05-30 NOTE — Telephone Encounter (Signed)
Pt is needing to talk with someone as soon as possible about getting her IUD-she states this has to be done by Saturday due to insurance reasons   Best number [phone]

## 2016-05-31 NOTE — Telephone Encounter (Signed)
Pt advised of status and given [employer] product number to call for concerns or status

## 2016-05-31 NOTE — Telephone Encounter (Signed)
Advised patient that mirena has been ordered but not received. Pt is angry and complains to me that she needs this by Saturday.  I explained that the device was ordered once she filled out paperwork once the place was found that would have this.now we wait for them to approve and send ( faxed 05/20/16) I called [phone] pharm to check status, they said 05/21/16 rx was sent to insurance but they could not verify insurance (even though we sent copy of card) cindy the rep re verified everything with me again and will call the patient to explain.

## 2016-05-31 NOTE — Telephone Encounter (Signed)
See also notes under 11/30 ph message. Dr Nolon Rod or Santiago Glad, do you know where this stands?

## 2016-06-07 NOTE — Telephone Encounter (Signed)
See 05/30/16 message for status

## 2016-06-13 DIAGNOSIS — Z30430 Encounter for insertion of intrauterine contraceptive device: Secondary | ICD-10-CM | POA: Diagnosis not present

## 2016-06-28 NOTE — Telephone Encounter (Signed)
PATIENT IS CALLING TO SEE IF WE RECEIVED HER MIRENA IUD? SHE SAID [facility] TOLD HER TODAY THAT IT WAS SHIPPED HERE THIS PAST WEEKEND. BEST PHONE [phone] (CELL) PATIENT SAID TO PLEASE PLEASE PLEASE LEAVE HER A VOICE MAIL IF SHE IS NOT ABLE TO ANSWER. [place]

## 2016-06-28 NOTE — Telephone Encounter (Signed)
Pt aware, Mirena has arrived Transferred to schedulers to set up appointment with Dr. Nolon Rod

## 2016-07-04 ENCOUNTER — Ambulatory Visit (INDEPENDENT_AMBULATORY_CARE_PROVIDER_SITE_OTHER): Payer: BLUE CROSS/BLUE SHIELD | Admitting: Family Medicine

## 2016-07-04 ENCOUNTER — Encounter: Payer: Self-pay | Admitting: Family Medicine

## 2016-07-04 VITALS — BP 126/88 | HR 85 | Temp 97.7°F | Resp 16 | Ht 66.0 in | Wt 237.0 lb

## 2016-07-04 DIAGNOSIS — N938 Other specified abnormal uterine and vaginal bleeding: Secondary | ICD-10-CM

## 2016-07-04 NOTE — Progress Notes (Signed)
I spoke to 8 obgyn practices in [place] today for an emergent appt today for IUD removal and new Mirena placement (pt has mirena) [facility] states she is their patient but she has a bill and if she calls them they can try to work with her today. Pt advised of this by aretha.

## 2016-07-04 NOTE — Progress Notes (Signed)
Patient presented for IUD insertion appt but we were unable to do this since our equipment was not here.  She was given her IUD mirena sent from the pharmacy to take home since the product is hers. She is instructed to return with her IUD when she is due for her appointment for IUD placement.

## 2016-07-04 NOTE — Patient Instructions (Signed)
     IF you received an x-ray today, you will receive an invoice from [facility]. Please contact  Radiology at [phone] with questions or concerns regarding your invoice.   IF you received labwork today, you will receive an invoice from [facility]. Please contact [facility] at [phone] with questions or concerns regarding your invoice.   Our billing staff will not be able to assist you with questions regarding bills from these companies.  You will be contacted with the lab results as soon as they are available. The fastest way to get your results is to activate your My Chart account. Instructions are located on the last page of this paperwork. If you have not heard from us regarding the results in 2 weeks, please contact this office.     

## 2016-08-03 IMAGING — CR DG SINUSES 1-2V
1 series · 1 of 1 positions shown · non-contrast
Comparison: None.

CLINICAL DATA: Sinus pain .

EXAM:
PARANASAL SINUSES - 1-2 VIEW

[waters]
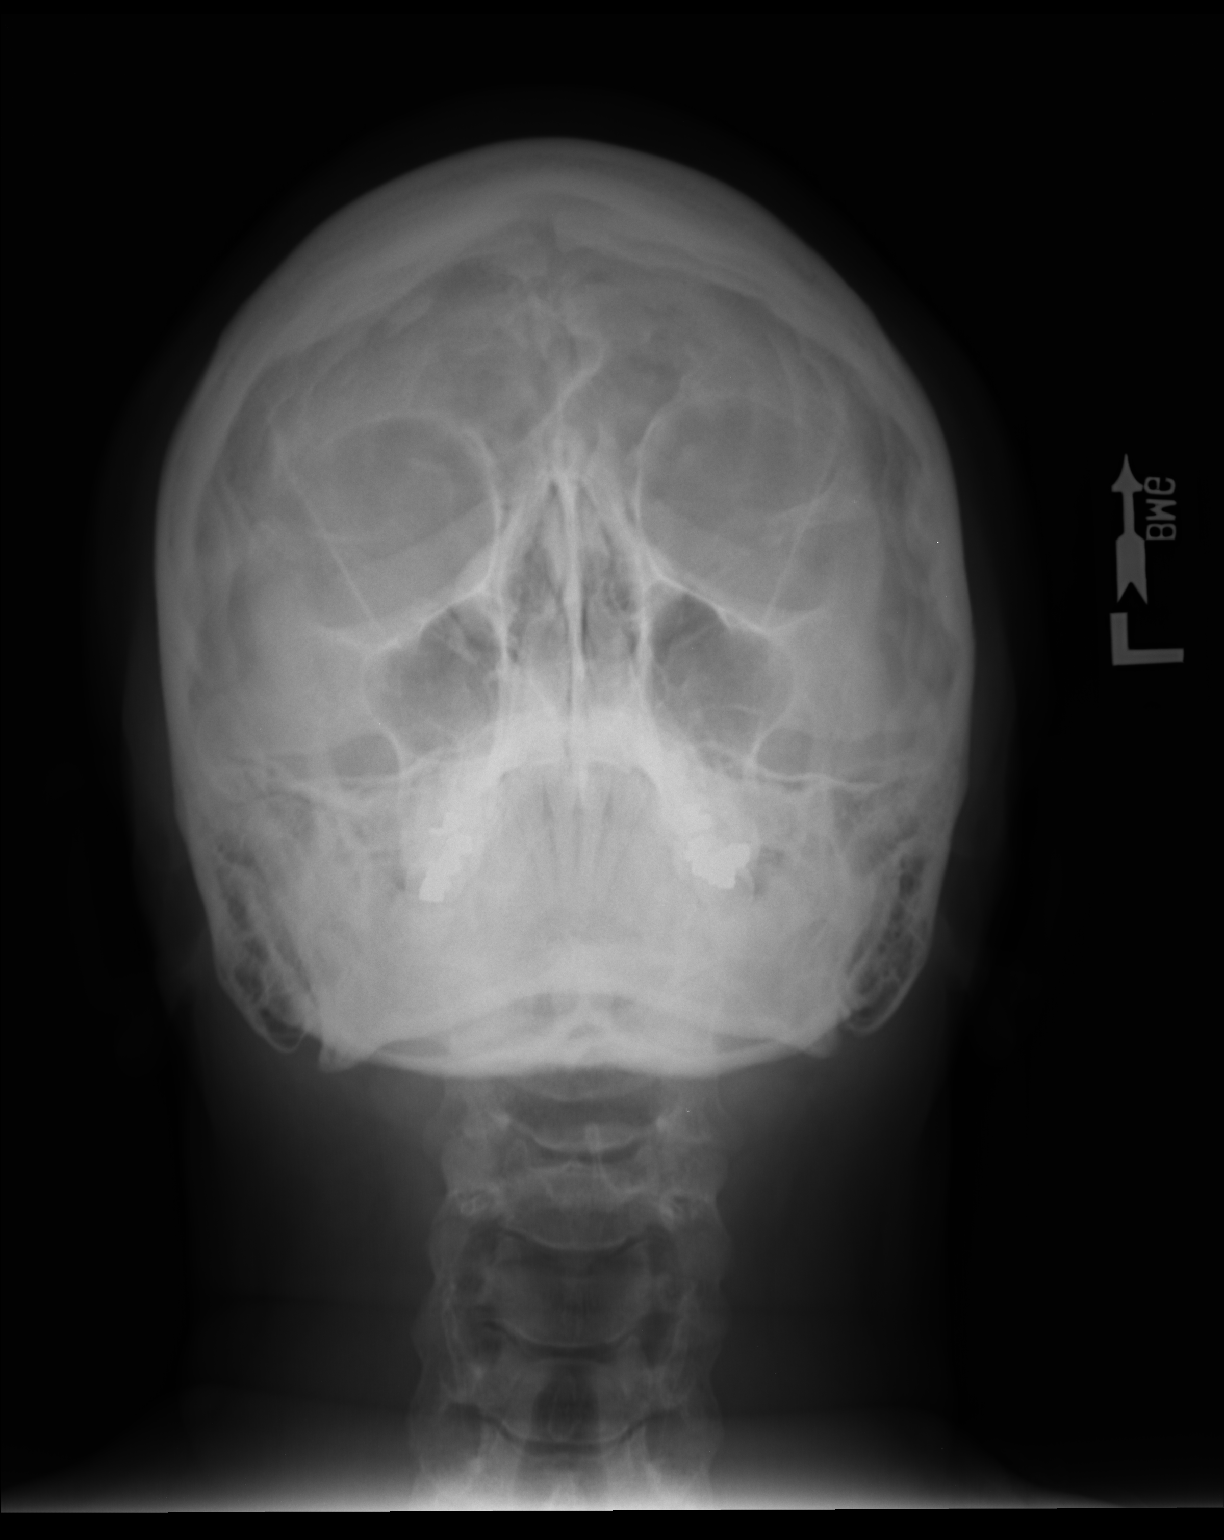

[1 of 1 positions shown; findings below may reference images not displayed]

FINDINGS: The paranasal sinus are aerated. There is no evidence of sinus
opacification air-fluid levels or mucosal thickening. No significant
bone abnormalities are seen.
IMPRESSION: Negative.

## 2016-08-29 ENCOUNTER — Encounter: Payer: Self-pay | Admitting: Family Medicine

## 2016-08-29 ENCOUNTER — Ambulatory Visit (INDEPENDENT_AMBULATORY_CARE_PROVIDER_SITE_OTHER): Payer: BLUE CROSS/BLUE SHIELD | Admitting: Family Medicine

## 2016-08-29 VITALS — BP 141/92 | HR 80 | Temp 98.0°F | Resp 16 | Ht 66.0 in | Wt 242.0 lb

## 2016-08-29 DIAGNOSIS — R350 Frequency of micturition: Secondary | ICD-10-CM

## 2016-08-29 DIAGNOSIS — Z113 Encounter for screening for infections with a predominantly sexual mode of transmission: Secondary | ICD-10-CM | POA: Diagnosis not present

## 2016-08-29 DIAGNOSIS — N938 Other specified abnormal uterine and vaginal bleeding: Secondary | ICD-10-CM | POA: Diagnosis not present

## 2016-08-29 DIAGNOSIS — Z30431 Encounter for routine checking of intrauterine contraceptive device: Secondary | ICD-10-CM

## 2016-08-29 DIAGNOSIS — Z124 Encounter for screening for malignant neoplasm of cervix: Secondary | ICD-10-CM | POA: Diagnosis not present

## 2016-08-29 LAB — POCT URINALYSIS DIP (MANUAL ENTRY)
Bilirubin, UA: NEGATIVE
Glucose, UA: NEGATIVE
Ketones, POC UA: NEGATIVE
Leukocytes, UA: NEGATIVE
Nitrite, UA: NEGATIVE
Protein Ur, POC: NEGATIVE
Spec Grav, UA: 1.01 (ref 1.030–1.035)
Urobilinogen, UA: 0.2 (ref ?–2.0)
pH, UA: 7 (ref 5.0–8.0)

## 2016-08-29 LAB — POCT URINE PREGNANCY: Preg Test, Ur: NEGATIVE

## 2016-08-29 MED ORDER — IBUPROFEN 200 MG PO TABS
400.0000 mg | ORAL_TABLET | Freq: Once | ORAL | Status: AC
  Administered 2016-08-29: 400 mg via ORAL

## 2016-08-29 NOTE — Progress Notes (Signed)
Chief Complaint  Patient presents with  . Contraception    patient is here for IUD placement    HPI  No LMP recorded. Patient is not currently having periods (Reason: IUD).  Pt reports that she has a history of irregular periods and used the IUD to treat this She would like another IUD placed She reports that she has her current Mirena in place for 7 years She is not sexually active  No history of migraines    Past Medical History:  Diagnosis Date  . Anxiety   . Bipolar 1 disorder ([place])   . Depression   . Depression 03/23/2011  . Hypertension   . Menometrorrhagia 03/23/2011  . Obesity     Current Outpatient Prescriptions  Medication Sig Dispense Refill  . budesonide (RHINOCORT AQUA) 32 MCG/ACT nasal spray Place 2 sprays into both nostrils daily. 8.6 g 5  . citalopram (CELEXA) 40 MG tablet Take 1 tablet (40 mg total) by mouth daily. 90 tablet 1  . clonazePAM (KLONOPIN) 0.5 MG tablet Take 0.5 mg by mouth daily as needed for anxiety.    . cyclobenzaprine (FLEXERIL) 10 MG tablet Take 1 tablet (10 mg total) by mouth 3 (three) times daily as needed for muscle spasms. 14 tablet 0  . levonorgestrel (MIRENA) 20 MCG/24HR IUD 1 each by Intrauterine route once.    Marland Kitchen oxyCODONE-acetaminophen (PERCOCET/ROXICET) 5-325 MG per tablet Take 1-2 tablets by mouth every 4 (four) hours as needed for moderate pain or severe pain.    . ranitidine (ZANTAC) 150 MG tablet Take 150 mg by mouth 2 (two) times daily as needed for heartburn.     No current facility-administered medications for this visit.     Allergies:  Allergies  Allergen Reactions  . Cauliflower Hives  . Demerol     Hallucinations   . Sulfa Drugs Cross Reactors Hives    Past Surgical History:  Procedure Laterality Date  . WISDOM TOOTH EXTRACTION      Social History   Social History  . Marital status: Single    Spouse name: N/A  . Number of children: N/A  . Years of education: N/A   Social History Main Topics  .  Smoking status: Never Smoker  . Smokeless tobacco: Never Used  . Alcohol use 0.0 oz/week     Comment: drink socially  . Drug use: No  . Sexual activity: Not Currently    Partners: Male    Birth control/ protection: Inserts     Comment: Nuvo Ring   Other Topics Concern  . None   Social History Narrative  . None    ROS No vaginal discharge No migraines No calf pain No urinary burning  +urinary frequency  Objective: Vitals:   08/29/16 0910  BP: (!) 141/92  Pulse: 80  Resp: 16  Temp: 98 F (36.7 C)  TempSrc: Oral  SpO2: 98%  Weight: 242 lb (109.8 kg)  Height: 5\' 6"  (1.676 m)    Physical Exam  Constitutional: She appears well-developed and well-nourished.  HENT:  Head: Normocephalic and atraumatic.  Eyes: Conjunctivae and EOM are normal.  Abdominal: Soft. Bowel sounds are normal. She exhibits no distension and no mass. There is no tenderness. There is no guarding.  Psychiatric: She has a normal mood and affect. Her behavior is normal. Judgment and thought content normal.     Vaginal exam Labia normal bilaterally without skin lesions Urethral meatus normal appearing without erythema Vagina with without discharge IUD strings visualized No CMT, ovaries small  and not palpable Uterus midline, nontender     Assessment and Plan Teresa Herrera was seen today for contraception.  Diagnoses and all orders for this visit:  DUB (dysfunctional uterine bleeding)- IUD placed today to continue to keep uterine lining thin -     POCT urine pregnancy -     ibuprofen (ADVIL,MOTRIN) tablet 400 mg; Take 2 tablets (400 mg total) by mouth once.  Urinary frequency- no UTI, discussed that she should return for evaluation for overactive bladder -     POCT urinalysis dipstick -     Urinalysis, microscopic only  Pap smear for cervical cancer screening- screening for cervical cancer reviewed -     Pap IG, CT/NG NAA, and HPV (high risk)  Screen for STD (sexually transmitted disease)-  discussed screen for gonorrhea and chlyamydia -     Pap IG, CT/NG NAA, and HPV (high risk)  Encounter for management of intrauterine contraceptive device (IUD), unspecified IUD management type- IUD placed today   PROCEDURE NOTE IUD insertion note Written consent obtained. She understood risks of IUD placement to include bleeding, infection, uterine perforation, risk of expulsion, risk of failure < 1%, increased risk of ectopic pregnancy in the event of failure.  Prior to the procedure being performed, the patient (or guardian) was asked to state their full name, date of birth, type of procedure being performed and the exact location of the operative site. This information was then checked against the documentation in the patient's chart. Prior to the procedure being performed, a "time out" was performed by the physician that confirmed the correct patient, procedure and site.   A bimanual exam was performed to establish the position of the uterus.  A speculum was placed in the vagina and the cervix was cleaned with BETADINE.  The uterus was sounded to 9 cm.  The IUD was then carefully inserted into the uterus. The Mirena intrauterine device was released into the uterine cavity and the applicator withdrawn. The string was cut to the appropriate length.  The patient tolerated the procedure well.  MIRENA IUD Lot #[id number] EXP 11/2018  A total of 30 minutes were spent face-to-face with the patient during this encounter and over half of that time was spent on counseling regarding the procedure and coordination of care.  5 minutes was spent performing the procedure.  [place]

## 2016-08-29 NOTE — Patient Instructions (Addendum)
     IF you received an x-ray today, you will receive an invoice from [facility]. Please contact [facility] at [phone] with questions or concerns regarding your invoice.   IF you received labwork today, you will receive an invoice from [facility]. Please contact [facility] at [phone] with questions or concerns regarding your invoice.   Our billing staff will not be able to assist you with questions regarding bills from these companies.  You will be contacted with the lab results as soon as they are available. The fastest way to get your results is to activate your My Chart account. Instructions are located on the last page of this paperwork. If you have not heard from us regarding the results in 2 weeks, please contact this office.     Intrauterine Device Information An intrauterine device (IUD) is inserted into your uterus to prevent pregnancy. There are two types of IUDs available:  Copper IUD-This type of IUD is wrapped in copper wire and is placed inside the uterus. Copper makes the uterus and fallopian tubes produce a fluid that kills sperm. The copper IUD can stay in place for 10 years.  Hormone IUD-This type of IUD contains the hormone progestin (synthetic progesterone). The hormone thickens the cervical mucus and prevents sperm from entering the uterus. It also thins the uterine lining to prevent implantation of a fertilized egg. The hormone can weaken or kill the sperm that get into the uterus. One type of hormone IUD can stay in place for 5 years, and another type can stay in place for 3 years.  Your health care provider will make sure you are a good candidate for a contraceptive IUD. Discuss with your health care provider the possible side effects. Advantages of an intrauterine device  IUDs are highly effective, reversible, long acting, and low maintenance.  There are no estrogen-related side effects.  An IUD can be used when breastfeeding.  IUDs are  not associated with weight gain.  The copper IUD works immediately after insertion.  The hormone IUD works right away if inserted within 7 days of your period starting. You will need to use a backup method of birth control for 7 days if the hormone IUD is inserted at any other time in your cycle.  The copper IUD does not interfere with your female hormones.  The hormone IUD can make heavy menstrual periods lighter and decrease cramping.  The hormone IUD can be used for 3 or 5 years.  The copper IUD can be used for 10 years. Disadvantages of an intrauterine device  The hormone IUD can be associated with irregular bleeding patterns.  The copper IUD can make your menstrual flow heavier and more painful.  You may experience cramping and vaginal bleeding after insertion. This information is not intended to replace advice given to you by your health care provider. Make sure you discuss any questions you have with your health care provider. Document Released: 04/24/2004 Document Revised: 10/27/2015 Document Reviewed: 11/09/2012 Elsevier Interactive Patient Education  2017 [employer]  

## 2016-08-30 LAB — URINALYSIS, MICROSCOPIC ONLY: Casts: NONE SEEN /lpf

## 2016-09-02 LAB — PAP IG, CT-NG NAA, HPV HIGH-RISK
Chlamydia, Nuc. Acid Amp: NEGATIVE
Gonococcus by Nucleic Acid Amp: NEGATIVE
HPV, high-risk: POSITIVE — AB
PAP Smear Comment: 0

## 2016-09-03 ENCOUNTER — Telehealth: Payer: Self-pay

## 2016-09-03 NOTE — Telephone Encounter (Signed)
Spoke with patient and gave her above message. Patient asked if there was anything that she could do to treat the HPV infection. Told patient that I would put in a message for Dr. Nolon Rod and when we heard back we would give her a call. Patient inquired about the HPV vaccine. Patient stated that she had no further questions.

## 2016-09-05 NOTE — Telephone Encounter (Signed)
Patient was counseled extensively on her pap smear test results. I spent ~15 minutes on the phone with her and tried to ask her to come in for follow up visit with Dr. Nolon Rod. I did my best to reassure her with the information Dr. Nolon Rod provided and more but patient still wants to ask for more information. I recommended she keep her follow up appointment.

## 2016-09-05 NOTE — Telephone Encounter (Signed)
Please let the patient know that for HPV vaccines are only for children and young adults age 42-26 yo  There is no treatment to eradicate this and this can be monitored with pap smears yearly. Let her know I can discuss this further at the next appt.

## 2016-09-26 ENCOUNTER — Ambulatory Visit (INDEPENDENT_AMBULATORY_CARE_PROVIDER_SITE_OTHER): Payer: BLUE CROSS/BLUE SHIELD | Admitting: Family Medicine

## 2016-09-26 ENCOUNTER — Encounter: Payer: Self-pay | Admitting: Family Medicine

## 2016-09-26 VITALS — BP 149/97 | HR 91 | Temp 98.2°F | Resp 16 | Ht 66.0 in | Wt 237.8 lb

## 2016-09-26 DIAGNOSIS — Z30431 Encounter for routine checking of intrauterine contraceptive device: Secondary | ICD-10-CM | POA: Diagnosis not present

## 2016-09-26 DIAGNOSIS — N3281 Overactive bladder: Secondary | ICD-10-CM

## 2016-09-26 DIAGNOSIS — B977 Papillomavirus as the cause of diseases classified elsewhere: Secondary | ICD-10-CM | POA: Diagnosis not present

## 2016-09-26 NOTE — Patient Instructions (Addendum)
IF you received an x-ray today, you will receive an invoice from [facility]. Please contact [facility] at [phone] with questions or concerns regarding your invoice.   IF you received labwork today, you will receive an invoice from [place]. Please contact [facility] at [phone] with questions or concerns regarding your invoice.   Our billing staff will not be able to assist you with questions regarding bills from these companies.  You will be contacted with the lab results as soon as they are available. The fastest way to get your results is to activate your My Chart account. Instructions are located on the last page of this paperwork. If you have not heard from [place] regarding the results in 2 weeks, please contact this office.      Human Papillomavirus Human papillomavirus (HPV) is the most common sexually transmitted infection (STI). It easily spreads from person to person (is highly contagious). HPV infections cause genital warts. Certain types of HPV may cause cancers, including cancer of the lower part of the uterus (cervix), vagina, outer female genital area (vulva), penis, anus, and rectum. HPV may also cause cancers of the oral cavity, such as the throat, tongue, and tonsils. There are many types of HPV. It usually does not cause symptoms. However, sometimes there are wart-like lesions in the throat or warts in the genital area that you can see or feel. It is possible to be infected for long periods and pass HPV to others without knowing it. What are the causes? HPV is caused by a virus that spreads from person to person through sexual contact. This includes oral, vaginal, or anal sex. What increases the risk? The following factors may make you more likely to develop this condition:  Having unprotected oral, vaginal, or anal sex.  Having several sex partners.  Having a sex partner who has other sex partners.  Having or having had another  STI.  Having a weak disease-fighting (immune) system.  Having damaged skin in the genital area. What are the signs or symptoms? Most people who have HPV do not have any symptoms. If symptoms are present, they may include:  Wartlike lesions in the throat (from having oral sex).  Warts on the infected skin or mucous membranes.  Genital warts that may itch, burn, bleed, or be painful during sexual intercourse. How is this diagnosed? If wartlike lesions are present in the throat or if genital warts are present, your health care provider can usually diagnose HPV with a physical exam. Genital warts are easily seen. In females, tests may be used to diagnose HPV, including:  A Pap test. A Pap test takes a sample of cells from your cervix to check for cancer and HPV infection.  An HPV test. This is similar to a Pap test and involves taking a sample of cells from your cervix.  Using a scope to view the cervix (colposcopy). This may be done if a pelvic exam or Pap test is abnormal. A sample of tissue may be removed for testing (biopsy) during the colposcopy. Currently, there is no test to detect HPV in males. How is this treated? There is no treatment for the virus itself. However, there are treatments for the health problems and symptoms HPV can cause. Your health care provider will monitor you closely after you are treated as HPV can come back and may need treatment again. Treatment for HPV may include:  Medicines, which may be injected or applied to genital warts in a cream,  lotion, liquid or gel form.  Use of a probe to apply extreme cold (cryotherapy) to the genital warts.  Application of an intense beam of light (laser treatment) on the genital warts.  Use of a probe to apply extreme heat (electrocautery) on the genital warts.  Surgery to remove the genital warts. Follow these instructions at home: Medicines   Take over-the-counter and prescription medicines only as told by your  health care provider. This include creams for itching or irritation.  Do not treat genital warts with medicines used for treating hand warts. General instructions   Do not touch or scratch the warts.  Do not have sex while you are being treated.  Do not douche or use tampons during treatment (women).  Tell your sex partner about your infection. He or she may also need to be treated.  If you become pregnant, tell your health care provider that you have HPV. Your health care provider will monitor you closely during pregnancy to make sure your baby is safe.  Keep all follow-up visits as told by your health care provider. This is important. How is this prevented?  Talk with your health care provider about getting the HPV vaccines. These vaccines prevent some HPV infections and cancers. The vaccines are recommended for males and females between the ages of 60 and 57. They will not work if you already have HPV, and they are not recommended for pregnant women.  After treatment, use condoms during sex to prevent future infections.  Have only one sex partner.  Have a sex partner who does not have other sex partners.  Get regular Pap tests as directed by your health care provider. Contact a health care provider if:  The treated skin becomes red, swollen, or painful.  You have a fever.  You feel generally ill.  You feel lumps or pimples sticking out in and around your genital area.  You develop bleeding of the vagina or the treatment area.  You have painful sexual intercourse. Summary  Human papillomavirus (HPV) is the most common sexually transmitted infection (STI) and is highly contagious.  Most people carrying HPV do not have any symptoms.  HPV can be prevented with vaccination. The vaccine is recommended for males and females between the ages of 30 and 5.  There is no treatment for the virus itself. However, there are treatments for the health problems and symptoms HPV can  cause. This information is not intended to replace advice given to you by your health care provider. Make sure you discuss any questions you have with your health care provider. Document Released: 08/11/2003 Document Revised: 04/29/2016 Document Reviewed: 04/29/2016 Elsevier Interactive Patient Education  2017 [employer]  Overactive Bladder, Adult Overactive bladder is a group of urinary symptoms. With overactive bladder, you may suddenly feel the need to pass urine (urinate) right away. After feeling this sudden urge, you might also leak urine if you cannot get to the bathroom fast enough (urinary incontinence). These symptoms might interfere with your daily work or social activities. Overactive bladder symptoms may also wake you up at night. Overactive bladder affects the nerve signals between your bladder and your brain. Your bladder may get the signal to empty before it is full. Very sensitive muscles can also make your bladder squeeze too soon. What are the causes? Many things can cause an overactive bladder. Possible causes include:  Urinary tract infection.  Infection of nearby tissues, such as the prostate.  Prostate enlargement.  Being pregnant with twins  or more (multiples).  Surgery on the uterus or urethra.  Bladder stones, inflammation, or tumors.  Drinking too much caffeine or alcohol.  Certain medicines, especially those that you take to help your body get rid of extra fluid (diuretics) by increasing urine production.  Muscle or nerve weakness, especially from:  A spinal cord injury.  Stroke.  Multiple sclerosis.  Parkinson disease.  Diabetes. This can cause a high urine volume that fills the bladder so quickly that the normal urge to urinate is triggered very strongly.  Constipation. A buildup of too much stool can put pressure on your bladder. What increases the risk? You may be at greater risk for overactive bladder if you:  Are an older  adult.  Smoke.  Are going through menopause.  Have prostate problems.  Have a neurological disease, such as stroke, dementia, Parkinson disease, or multiple sclerosis (MS).  Eat or drink things that irritate the bladder. These include alcohol, spicy food, and caffeine.  Are overweight or obese. What are the signs or symptoms? The signs and symptoms of an overactive bladder include:  Sudden, strong urges to urinate.  Leaking urine.  Urinating eight or more times per day.  Waking up to urinate two or more times per night. How is this diagnosed? Your health care provider may suspect overactive bladder based on your symptoms. The health care provider will do a physical exam and take your medical history. Blood or urine tests may also be done. For example, you might need to have a bladder function test to check how well you can hold your urine. You might also need to see a health care provider who specializes in the urinary tract (urologist). How is this treated? Treatment for overactive bladder depends on the cause of your condition and whether it is mild or severe. Certain treatments can be done in your health care provider's office or clinic. You can also make lifestyle changes at home. Options include: Behavioral Treatments   Biofeedback. A specialist uses sensors to help you become aware of your body's signals.  Keeping a daily log of when you need to urinate and what happens after the urge. This may help you manage your condition.  Bladder training. This helps you learn to control the urge to urinate by following a schedule that directs you to urinate at regular intervals (timed voiding). At first, you might have to wait a few minutes after feeling the urge. In time, you should be able to schedule bathroom visits an hour or more apart.  Kegel exercises. These are exercises to strengthen the pelvic floor muscles, which support the bladder. Toning these muscles can help you control  urination, even if your bladder muscles are overactive. A specialist will teach you how to do these exercises correctly. They require daily practice.  Weight loss. If you are obese or overweight, losing weight might relieve your symptoms of overactive bladder. Talk to your health care provider about losing weight and whether there is a specific program or method that would work best for you.  Diet change. This might help if constipation is making your overactive bladder worse. Your health care provider or a dietitian can explain ways to change what you eat to ease constipation. You might also need to consume less alcohol and caffeine or drink other fluids at different times of the day.  Stopping smoking.  Wearing pads to absorb leakage while you wait for other treatments to take effect. Physical Treatments   Electrical stimulation. Electrodes send  gentle pulses of electricity to strengthen the nerves or muscles that help to control the bladder. Sometimes, the electrodes are placed outside of the body. In other cases, they might be placed inside the body (implanted). This treatment can take several months to have an effect.  Supportive devices. Women may need a plastic device that fits into the vagina and supports the bladder (pessary). Medicines  Several medicines can help treat overactive bladder and are usually used along with other treatments. Some are injected into the muscles involved in urination. Others come in pill form. Your health care provider may prescribe:  Antispasmodics. These medicines block the signals that the nerves send to the bladder. This keeps the bladder from releasing urine at the wrong time.  Tricyclic antidepressants. These types of antidepressants also relax bladder muscles. Surgery   You may have a device implanted to help manage the nerve signals that indicate when you need to urinate.  You may have surgery to implant electrodes for electrical  stimulation.  Sometimes, very severe cases of overactive bladder require surgery to change the shape of the bladder. Follow these instructions at home:  Take medicines only as directed by your health care provider.  Use any implants or a pessary as directed by your health care provider.  Make any diet or lifestyle changes that are recommended by your health care provider. These might include:  Drinking less fluid or drinking at different times of the day. If you need to urinate often during the night, you may need to stop drinking fluids early in the evening.  Cutting down on caffeine or alcohol. Both can make an overactive bladder worse. Caffeine is found in coffee, tea, and sodas.  Doing Kegel exercises to strengthen muscles.  Losing weight if you need to.  Eating a healthy and balanced diet to prevent constipation.  Keep a journal or log to track how much and when you drink and also when you feel the need to urinate. This will help your health care provider to monitor your condition. Contact a health care provider if:  Your symptoms do not get better after treatment.  Your pain and discomfort are getting worse.  You have more frequent urges to urinate.  You have a fever. Get help right away if: You are not able to control your bladder at all. This information is not intended to replace advice given to you by your health care provider. Make sure you discuss any questions you have with your health care provider. Document Released: 03/17/2009 Document Revised: 10/27/2015 Document Reviewed: 10/14/2013 Elsevier Interactive Patient Education  2017 [employer].

## 2016-09-26 NOTE — Progress Notes (Signed)
Chief Complaint  Patient presents with  . Follow-up    HPI   Patient is here for IUD check and discussion about hpv positive pap smear She denies any abdominal cramping or pain She has not had any menstrual bleeding  HPV positive pap She reports that she ha a normal pap smear more than 7 years ago when her IUD was placed. She wanted to know if she has to tell her sexual partners in the future about her HPV She had questions about whether a hysterectomy wouldn't be better for cancer prevention.  She has been having difficulty with her weight.  She plans to increase her exercise In the past few years she has had increased difficulty with her urination She states that her bladder seems to be unable to hold much urine She denies dysuria She reports that if she drinks and eats she has to urinate.  She has been screened for diabetes Lab Results  Component Value Date   HGBA1C 5.4 09/26/2016    Wt Readings from Last 3 Encounters:  09/26/16 237 lb 12.8 oz (107.9 kg)  08/29/16 242 lb (109.8 kg)  07/04/16 237 lb (107.5 kg)    Past Medical History:  Diagnosis Date  . Anxiety   . Bipolar 1 disorder ([place])   . Depression   . Depression 03/23/2011  . Hypertension   . Menometrorrhagia 03/23/2011  . Obesity     Current Outpatient Prescriptions  Medication Sig Dispense Refill  . budesonide (RHINOCORT AQUA) 32 MCG/ACT nasal spray Place 2 sprays into both nostrils daily. 8.6 g 5  . citalopram (CELEXA) 40 MG tablet Take 1 tablet (40 mg total) by mouth daily. 90 tablet 1  . clonazePAM (KLONOPIN) 0.5 MG tablet Take 0.5 mg by mouth daily as needed for anxiety.    . cyclobenzaprine (FLEXERIL) 10 MG tablet Take 1 tablet (10 mg total) by mouth 3 (three) times daily as needed for muscle spasms. 14 tablet 0  . levonorgestrel (MIRENA) 20 MCG/24HR IUD 1 each by Intrauterine route once.    Marland Kitchen oxyCODONE-acetaminophen (PERCOCET/ROXICET) 5-325 MG per tablet Take 1-2 tablets by mouth every 4 (four)  hours as needed for moderate pain or severe pain.    . ranitidine (ZANTAC) 150 MG tablet Take 150 mg by mouth 2 (two) times daily as needed for heartburn.     No current facility-administered medications for this visit.     Allergies:  Allergies  Allergen Reactions  . Cauliflower Hives  . Demerol     Hallucinations   . Sulfa Drugs Cross Reactors Hives    Past Surgical History:  Procedure Laterality Date  . WISDOM TOOTH EXTRACTION      Social History   Social History  . Marital status: Single    Spouse name: N/A  . Number of children: N/A  . Years of education: N/A   Social History Main Topics  . Smoking status: Never Smoker  . Smokeless tobacco: Never Used  . Alcohol use 0.0 oz/week     Comment: drink socially  . Drug use: No  . Sexual activity: Not Currently    Partners: Male    Birth control/ protection: Inserts     Comment: Nuvo Ring   Other Topics Concern  . None   Social History Narrative  . None    Review of Systems  Constitutional: Negative for chills and fever.  Genitourinary:       See hpi  Skin: Negative for itching and rash.  Endo/Heme/Allergies: Negative for polydipsia.  Does not bruise/bleed easily.   See hpi  Objective: Vitals:   09/26/16 1206  BP: (!) 149/97  Pulse: 91  Resp: 16  Temp: 98.2 F (36.8 C)  TempSrc: Oral  SpO2: 98%  Weight: 237 lb 12.8 oz (107.9 kg)  Height: 5\' 6"  (1.676 m)    Physical Exam  Constitutional: She appears well-developed and well-nourished.  HENT:  Head: Normocephalic and atraumatic.  Neck: Normal range of motion. Neck supple.  Cardiovascular: Normal rate, regular rhythm and normal heart sounds.   Pulmonary/Chest: Effort normal and breath sounds normal. No respiratory distress. She has no wheezes.  Abdominal: Soft. Bowel sounds are normal. She exhibits no distension. There is no tenderness.  Psychiatric: She has a normal mood and affect. Her behavior is normal. Judgment and thought content normal.    Vaginal exam Labia normal bilaterally without skin lesions Urethral meatus normal appearing without erythema Vagina without discharge No CMT, ovaries small and not palpable Uterus midline, nontender IUD string visualized but was very short.  Attempted to pull string but string was no longer visualized.  To avoid pulling iud did not attempt to retrieve string from cervical os.     Assessment and Plan Levi was seen today for follow-up.  Diagnoses and all orders for this visit:  IUD check up - iud string was visualized  HPV in female - discussed hpv presence and risk of cervical cancer Discussed management and pap smear   Overactive bladder - discussed that she probably has overactive bladder and reduced bladder capacity due to morbid obesity Screened for diabetes and she is not diabetic Discussed that she may need a trial of a medication to decrease overactive bladder Discussed bladder training and discussed kegels -     Basic metabolic panel -     Hemoglobin A1c  Morbid obesity ([place])-  Discussed diet and exercise Pt to follow up for weight loss management   -     Lipid panel -     Hemoglobin A1c     Tamar Miano A [facility] Newborn

## 2016-09-27 LAB — BASIC METABOLIC PANEL
BUN/Creatinine Ratio: 14 (ref 9–23)
BUN: 11 mg/dL (ref 6–24)
CO2: 23 mmol/L (ref 18–29)
Calcium: 9.1 mg/dL (ref 8.7–10.2)
Chloride: 101 mmol/L (ref 96–106)
Creatinine, Ser: 0.79 mg/dL (ref 0.57–1.00)
GFR calc Af Amer: 108 mL/min/{1.73_m2} (ref >59)
GFR calc non Af Amer: 93 mL/min/{1.73_m2} (ref >59)
Glucose: 86 mg/dL (ref 65–99)
Potassium: 3.8 mmol/L (ref 3.5–5.2)
Sodium: 142 mmol/L (ref 134–144)

## 2016-09-27 LAB — LIPID PANEL
Chol/HDL Ratio: 4 ratio (ref 0.0–4.4)
Cholesterol, Total: 188 mg/dL (ref 100–199)
HDL: 47 mg/dL (ref >39)
LDL Calculated: 124 mg/dL — ABNORMAL HIGH (ref 0–99)
Triglycerides: 85 mg/dL (ref 0–149)
VLDL Cholesterol Cal: 17 mg/dL (ref 5–40)

## 2016-09-27 LAB — HEMOGLOBIN A1C
Est. average glucose Bld gHb Est-mCnc: 108 mg/dL
Hgb A1c MFr Bld: 5.4 % (ref 4.8–5.6)

## 2016-09-30 ENCOUNTER — Encounter: Payer: Self-pay | Admitting: Family Medicine

## 2016-12-26 ENCOUNTER — Ambulatory Visit: Payer: BLUE CROSS/BLUE SHIELD | Admitting: Family Medicine

## 2017-07-05 ENCOUNTER — Encounter: Payer: Self-pay | Admitting: Family Medicine

## 2017-07-05 ENCOUNTER — Other Ambulatory Visit: Payer: Self-pay

## 2017-07-05 ENCOUNTER — Ambulatory Visit: Payer: BLUE CROSS/BLUE SHIELD | Admitting: Family Medicine

## 2017-07-05 VITALS — BP 150/98 | HR 99 | Temp 98.0°F | Resp 18 | Ht 66.0 in | Wt 241.8 lb

## 2017-07-05 DIAGNOSIS — R030 Elevated blood-pressure reading, without diagnosis of hypertension: Secondary | ICD-10-CM | POA: Diagnosis not present

## 2017-07-05 DIAGNOSIS — J0100 Acute maxillary sinusitis, unspecified: Secondary | ICD-10-CM | POA: Diagnosis not present

## 2017-07-05 MED ORDER — AMOXICILLIN-POT CLAVULANATE 875-125 MG PO TABS: 1.0000 | ORAL_TABLET | Freq: Two times a day (BID) | ORAL | 0 refills | Status: AC

## 2017-07-05 MED ORDER — FLUCONAZOLE 150 MG PO TABS: 150.0000 mg | ORAL_TABLET | Freq: Once | ORAL | 0 refills | Status: AC

## 2017-07-05 NOTE — Patient Instructions (Addendum)
   IF you received an x-ray today, you will receive an invoice from [facility]. Please contact [facility] at [phone] with questions or concerns regarding your invoice.   IF you received labwork today, you will receive an invoice from [facility]. Please contact [facility] at [phone] with questions or concerns regarding your invoice.   Our billing staff will not be able to assist you with questions regarding bills from these companies.  You will be contacted with the lab results as soon as they are available. The fastest way to get your results is to activate your My Chart account. Instructions are located on the last page of this paperwork. If you have not heard from us regarding the results in 2 weeks, please contact this office.      Sinusitis, Adult Sinusitis is soreness and inflammation of your sinuses. Sinuses are hollow spaces in the bones around your face. Your sinuses are located:  Around your eyes.  In the middle of your forehead.  Behind your nose.  In your cheekbones.  Your sinuses and nasal passages are lined with a stringy fluid (mucus). Mucus normally drains out of your sinuses. When your nasal tissues become inflamed or swollen, the mucus can become trapped or blocked so air cannot flow through your sinuses. This allows bacteria, viruses, and funguses to grow, which leads to infection. Sinusitis can develop quickly and last for 7?10 days (acute) or for more than 12 weeks (chronic). Sinusitis often develops after a cold. What are the causes? This condition is caused by anything that creates swelling in the sinuses or stops mucus from draining, including:  Allergies.  Asthma.  Bacterial or viral infection.  Abnormally shaped bones between the nasal passages.  Nasal growths that contain mucus (nasal polyps).  Narrow sinus openings.  Pollutants, such as chemicals or irritants in the air.  A foreign object stuck in the nose.  A fungal  infection. This is rare.  What increases the risk? The following factors may make you more likely to develop this condition:  Having allergies or asthma.  Having had a recent cold or respiratory tract infection.  Having structural deformities or blockages in your nose or sinuses.  Having a weak immune system.  Doing a lot of swimming or diving.  Overusing nasal sprays.  Smoking.  What are the signs or symptoms? The main symptoms of this condition are pain and a feeling of pressure around the affected sinuses. Other symptoms include:  Upper toothache.  Earache.  Headache.  Bad breath.  Decreased sense of smell and taste.  A cough that may get worse at night.  Fatigue.  Fever.  Thick drainage from your nose. The drainage is often green and it may contain pus (purulent).  Stuffy nose or congestion.  Postnasal drip. This is when extra mucus collects in the throat or back of the nose.  Swelling and warmth over the affected sinuses.  Sore throat.  Sensitivity to light.  How is this diagnosed? This condition is diagnosed based on symptoms, a medical history, and a physical exam. To find out if your condition is acute or chronic, your health care provider may:  Look in your nose for signs of nasal polyps.  Tap over the affected sinus to check for signs of infection.  View the inside of your sinuses using an imaging device that has a light attached (endoscope).  If your health care provider suspects that you have chronic sinusitis, you may also:  Be tested for allergies.    Have a sample of mucus taken from your nose (nasal culture) and checked for bacteria.  Have a mucus sample examined to see if your sinusitis is related to an allergy.  If your sinusitis does not respond to treatment and it lasts longer than 8 weeks, you may have an MRI or CT scan to check your sinuses. These scans also help to determine how severe your infection is. In rare cases, a bone  biopsy may be done to rule out more serious types of fungal sinus disease. How is this treated? Treatment for sinusitis depends on the cause and whether your condition is chronic or acute. If a virus is causing your sinusitis, your symptoms will go away on their own within 10 days. You may be given medicines to relieve your symptoms, including:  Topical nasal decongestants. They shrink swollen nasal passages and let mucus drain from your sinuses.  Antihistamines. These drugs block inflammation that is triggered by allergies. This can help to ease swelling in your nose and sinuses.  Topical nasal corticosteroids. These are nasal sprays that ease inflammation and swelling in your nose and sinuses.  Nasal saline washes. These rinses can help to get rid of thick mucus in your nose.  If your condition is caused by bacteria, you will be given an antibiotic medicine. If your condition is caused by a fungus, you will be given an antifungal medicine. Surgery may be needed to correct underlying conditions, such as narrow nasal passages. Surgery may also be needed to remove polyps. Follow these instructions at home: Medicines  Take, use, or apply over-the-counter and prescription medicines only as told by your health care provider. These may include nasal sprays.  If you were prescribed an antibiotic medicine, take it as told by your health care provider. Do not stop taking the antibiotic even if you start to feel better. Hydrate and Humidify  Drink enough water to keep your urine clear or pale yellow. Staying hydrated will help to thin your mucus.  Use a cool mist humidifier to keep the humidity level in your home above 50%.  Inhale steam for 10-15 minutes, 3-4 times a day or as told by your health care provider. You can do this in the bathroom while a hot shower is running.  Limit your exposure to cool or dry air. Rest  Rest as much as possible.  Sleep with your head raised  (elevated).  Make sure to get enough sleep each night. General instructions  Apply a warm, moist washcloth to your face 3-4 times a day or as told by your health care provider. This will help with discomfort.  Wash your hands often with soap and water to reduce your exposure to viruses and other germs. If soap and water are not available, use hand sanitizer.  Do not smoke. Avoid being around people who are smoking (secondhand smoke).  Keep all follow-up visits as told by your health care provider. This is important. Contact a health care provider if:  You have a fever.  Your symptoms get worse.  Your symptoms do not improve within 10 days. Get help right away if:  You have a severe headache.  You have persistent vomiting.  You have pain or swelling around your face or eyes.  You have vision problems.  You develop confusion.  Your neck is stiff.  You have trouble breathing. This information is not intended to replace advice given to you by your health care provider. Make sure you discuss any questions you   have with your health care provider. Document Released: 05/21/2005 Document Revised: 01/15/2016 Document Reviewed: 03/16/2015 Elsevier Interactive Patient Education  2018 [employer]  

## 2017-07-05 NOTE — Progress Notes (Signed)
2/1/20192:00 PM  Teresa Herrera 03-08-1976, 42 y.o. female [id number]  Chief Complaint  Patient presents with  . Ear Pain    pt states it feels warm and can feel liquid in both ears and feels like pressure  x3days     HPI:   Patient is a 42 y.o. female who presents today for one week of nasal congestion, sinus pressure that has now worsened over the past 3 days with right sided ear pain, ear warmth, dizziness, tooth pain and sore throat. Blowing her nose causes significant pain. She has been taking mucinex, flonase and sudafed for the past week. She denies any fever or chills. She denies cough or sob.  Depression screen [facility] 2/9 07/05/2017 09/26/2016 08/29/2016  Decreased Interest 0 0 0  Down, Depressed, Hopeless 0 0 0  PHQ - 2 Score 0 0 0  Altered sleeping - - -  Tired, decreased energy - - -  Change in appetite - - -  Feeling bad or failure about yourself  - - -  Trouble concentrating - - -  Moving slowly or fidgety/restless - - -  Suicidal thoughts - - -  PHQ-9 Score - - -    Allergies  Allergen Reactions  . Cauliflower Hives  . Demerol     Hallucinations   . Sulfa Drugs Cross Reactors Hives    Prior to Admission medications   Medication Sig Start Date End Date Taking? Authorizing Provider  budesonide (RHINOCORT AQUA) 32 MCG/ACT nasal spray Place 2 sprays into both nostrils daily. 04/13/14  Yes Elby Beck, FNP  citalopram (CELEXA) 40 MG tablet Take 1 tablet (40 mg total) by mouth daily. 06/01/14  Yes Elby Beck, FNP  clonazePAM (KLONOPIN) 0.5 MG tablet Take 0.5 mg by mouth daily as needed for anxiety.   Yes [provider]  cyclobenzaprine (FLEXERIL) 10 MG tablet Take 1 tablet (10 mg total) by mouth 3 (three) times daily as needed for muscle spasms. 03/31/16  Yes Forrest Moron, MD  fluticasone (FLONASE) 50 MCG/ACT nasal spray Place into both nostrils as needed for allergies or rhinitis.   Yes [provider]  levonorgestrel  (MIRENA) 20 MCG/24HR IUD 1 each by Intrauterine route once.   Yes [provider]  oxyCODONE-acetaminophen (PERCOCET/ROXICET) 5-325 MG per tablet Take 1-2 tablets by mouth every 4 (four) hours as needed for moderate pain or severe pain.   Yes [provider]  ranitidine (ZANTAC) 150 MG tablet Take 150 mg by mouth 2 (two) times daily as needed for heartburn.   Yes [provider]    Past Medical History:  Diagnosis Date  . Anxiety   . Bipolar 1 disorder ([place])   . Depression   . Depression 03/23/2011  . Hypertension   . Menometrorrhagia 03/23/2011  . Obesity     Past Surgical History:  Procedure Laterality Date  . WISDOM TOOTH EXTRACTION      Social History   Tobacco Use  . Smoking status: Never Smoker  . Smokeless tobacco: Never Used  Substance Use Topics  . Alcohol use: Yes    Alcohol/week: 0.0 oz    Comment: drink socially    Family History  Problem Relation Age of Onset  . Hypertension Mother   . Heart disease Maternal Grandmother   . Hyperlipidemia Maternal Grandmother   . Hypertension Maternal Grandmother     ROS Per hpi  OBJECTIVE:  Blood pressure (!) 150/98, pulse 99, temperature 98 F (36.7 C), temperature source Oral,  resp. rate 18, height 5\' 6"  (1.676 m), weight 241 lb 12.8 oz (109.7 kg), SpO2 98 %.    Physical Exam  Constitutional: She is oriented to person, place, and time and well-developed, well-nourished, and in no distress.  HENT:  Head: Normocephalic and atraumatic.  Right Ear: Hearing, external ear and ear canal normal. Tympanic membrane is retracted.  Left Ear: Hearing, tympanic membrane, external ear and ear canal normal.  Nose: Mucosal edema and rhinorrhea present. Right sinus exhibits maxillary sinus tenderness and frontal sinus tenderness. Left sinus exhibits no maxillary sinus tenderness and no frontal sinus tenderness.  Mouth/Throat: Posterior oropharyngeal erythema present.  Eyes: EOM are normal. Pupils are  equal, round, and reactive to light.  Neck: Neck supple.  Cardiovascular: Normal rate, regular rhythm and normal heart sounds. Exam reveals no gallop and no friction rub.  No murmur heard. Pulmonary/Chest: Effort normal and breath sounds normal. She has no wheezes. She has no rales.  Lymphadenopathy:    She has no cervical adenopathy.  Neurological: She is alert and oriented to person, place, and time. Gait normal.  Skin: Skin is warm and dry.     ASSESSMENT and PLAN  1. Acute non-recurrent maxillary sinusitis Discussed supportive measures, new meds r/se/b and RTC precautions. Patient educational handout given.  2. Elevated BP without diagnosis of hypertension Currently ill and taking sudafed, discussed rechecking BP once back to baseline health  Other orders - fluticasone (FLONASE) 50 MCG/ACT nasal spray; Place into both nostrils as needed for allergies or rhinitis. - amoxicillin-clavulanate (AUGMENTIN) 875-125 MG tablet; Take 1 tablet by mouth 2 (two) times daily. - fluconazole (DIFLUCAN) 150 MG tablet; Take 1 tablet (150 mg total) by mouth once for 1 dose.  Return if symptoms worsen or Herrera to improve.    Rutherford Guys, MD [facility] [place], [place] [address] Ph.  [phone] Fax [phone]

## 2020-07-14 ENCOUNTER — Ambulatory Visit: Payer: Self-pay

## 2020-07-14 ENCOUNTER — Ambulatory Visit: Payer: Self-pay | Attending: Internal Medicine

## 2020-07-14 DIAGNOSIS — Z23 Encounter for immunization: Secondary | ICD-10-CM

## 2020-07-14 NOTE — Progress Notes (Signed)
   Covid-19 Vaccination Clinic  Name:  Teresa Herrera    MRN: [id number] DOB: 1975/10/29  07/14/2020  [name] was observed post Covid-19 immunization for 15 minutes without incident. She was provided with Vaccine Information Sheet and instruction to access the V-Safe system.   [name] was instructed to call 911 with any severe reactions post vaccine: Marland Kitchen Difficulty breathing  . Swelling of face and throat  . A fast heartbeat  . A bad rash all over body  . Dizziness and weakness   Immunizations Administered    Name Date Dose VIS Date Route   Moderna Covid-19 Booster Vaccine 07/14/2020 11:32 AM 0.25 mL 03/23/2020 Intramuscular   Manufacturer: Moderna   Lot: [id number]   Hiko: [id number]

## 2021-04-07 DIAGNOSIS — Z1231 Encounter for screening mammogram for malignant neoplasm of breast: Secondary | ICD-10-CM | POA: Diagnosis not present

## 2021-04-14 DIAGNOSIS — R922 Inconclusive mammogram: Secondary | ICD-10-CM | POA: Diagnosis not present

## 2021-04-14 DIAGNOSIS — R928 Other abnormal and inconclusive findings on diagnostic imaging of breast: Secondary | ICD-10-CM | POA: Diagnosis not present

## 2021-05-08 DIAGNOSIS — Z131 Encounter for screening for diabetes mellitus: Secondary | ICD-10-CM | POA: Diagnosis not present

## 2021-05-08 DIAGNOSIS — Z13220 Encounter for screening for lipoid disorders: Secondary | ICD-10-CM | POA: Diagnosis not present

## 2021-05-08 DIAGNOSIS — Z01419 Encounter for gynecological examination (general) (routine) without abnormal findings: Secondary | ICD-10-CM | POA: Diagnosis not present

## 2021-05-08 DIAGNOSIS — R5383 Other fatigue: Secondary | ICD-10-CM | POA: Diagnosis not present
